# Patient Record
Sex: Female | Born: 1970 | Race: White | Hispanic: No | Marital: Single | Smoking: Never smoker
Health system: Southern US, Community
[De-identification: ages and names within clinical notes are randomized; demographics above are authoritative.]

## PROBLEM LIST (undated history)

## (undated) DIAGNOSIS — E282 Polycystic ovarian syndrome: Secondary | ICD-10-CM

## (undated) DIAGNOSIS — T7840XA Allergy, unspecified, initial encounter: Secondary | ICD-10-CM

## (undated) DIAGNOSIS — R7303 Prediabetes: Secondary | ICD-10-CM

## (undated) HISTORY — DX: Allergy, unspecified, initial encounter: T78.40XA

## (undated) HISTORY — DX: Polycystic ovarian syndrome: E28.2

## (undated) HISTORY — PX: TONSILLECTOMY: SUR1361

## (undated) HISTORY — DX: Prediabetes: R73.03

## (undated) HISTORY — PX: COLONOSCOPY: SHX174

## (undated) HISTORY — PX: POLYPECTOMY: SHX149

## (undated) HISTORY — PX: CHOLECYSTECTOMY: SHX55

---

## 2003-07-03 ENCOUNTER — Other Ambulatory Visit: Admission: RE | Admit: 2003-07-03 | Discharge: 2003-07-03 | Payer: Self-pay | Admitting: Obstetrics and Gynecology

## 2003-09-03 ENCOUNTER — Encounter: Admission: RE | Admit: 2003-09-03 | Discharge: 2003-09-03 | Payer: Self-pay | Admitting: Family Medicine

## 2003-09-30 ENCOUNTER — Encounter (INDEPENDENT_AMBULATORY_CARE_PROVIDER_SITE_OTHER): Payer: Self-pay | Admitting: Specialist

## 2003-10-01 ENCOUNTER — Inpatient Hospital Stay (HOSPITAL_COMMUNITY): Admission: RE | Admit: 2003-10-01 | Discharge: 2003-10-03 | Payer: Self-pay | Admitting: General Surgery

## 2004-03-26 ENCOUNTER — Other Ambulatory Visit: Admission: RE | Admit: 2004-03-26 | Discharge: 2004-03-26 | Payer: Self-pay | Admitting: Obstetrics and Gynecology

## 2004-09-29 ENCOUNTER — Inpatient Hospital Stay (HOSPITAL_COMMUNITY): Admission: AD | Admit: 2004-09-29 | Discharge: 2004-09-29 | Payer: Self-pay | Admitting: Obstetrics and Gynecology

## 2004-10-03 ENCOUNTER — Inpatient Hospital Stay (HOSPITAL_COMMUNITY): Admission: AD | Admit: 2004-10-03 | Discharge: 2004-10-03 | Payer: Self-pay | Admitting: Obstetrics and Gynecology

## 2004-10-08 ENCOUNTER — Inpatient Hospital Stay (HOSPITAL_COMMUNITY): Admission: AD | Admit: 2004-10-08 | Discharge: 2004-10-10 | Payer: Self-pay | Admitting: Obstetrics and Gynecology

## 2004-11-26 ENCOUNTER — Other Ambulatory Visit: Admission: RE | Admit: 2004-11-26 | Discharge: 2004-11-26 | Payer: Self-pay | Admitting: Obstetrics and Gynecology

## 2008-08-13 ENCOUNTER — Encounter: Admission: RE | Admit: 2008-08-13 | Discharge: 2008-08-13 | Payer: Self-pay | Admitting: Family Medicine

## 2009-03-03 ENCOUNTER — Emergency Department (HOSPITAL_COMMUNITY): Admission: EM | Admit: 2009-03-03 | Discharge: 2009-03-03 | Payer: Self-pay | Admitting: Emergency Medicine

## 2009-07-08 ENCOUNTER — Emergency Department (HOSPITAL_COMMUNITY): Admission: EM | Admit: 2009-07-08 | Discharge: 2009-07-08 | Payer: Self-pay | Admitting: Emergency Medicine

## 2010-06-23 ENCOUNTER — Encounter: Payer: Self-pay | Admitting: Gastroenterology

## 2010-06-25 ENCOUNTER — Encounter: Payer: Self-pay | Admitting: Gastroenterology

## 2010-06-25 ENCOUNTER — Encounter
Admission: RE | Admit: 2010-06-25 | Discharge: 2010-06-25 | Payer: Self-pay | Source: Home / Self Care | Attending: Endocrinology | Admitting: Endocrinology

## 2010-07-09 ENCOUNTER — Encounter: Payer: Self-pay | Admitting: Gastroenterology

## 2010-07-13 ENCOUNTER — Ambulatory Visit (INDEPENDENT_AMBULATORY_CARE_PROVIDER_SITE_OTHER): Payer: BC Managed Care – PPO | Admitting: Gastroenterology

## 2010-07-13 ENCOUNTER — Encounter: Payer: Self-pay | Admitting: Gastroenterology

## 2010-07-13 DIAGNOSIS — R1031 Right lower quadrant pain: Secondary | ICD-10-CM | POA: Insufficient documentation

## 2010-07-15 NOTE — Letter (Signed)
Summary: New Patient letter  [facility]  [address] [place], [place] [address]   Phone: [phone]  Fax: [phone]       07/09/2010 MRN: [id number]  [facility] [address] [place], [place]  [address]  Dear Ms. Umbarger,  Welcome to the Gastroenterology Division at [facility].    You are scheduled to see Dr.  Arlyce Dice on 07-13-10 at 3pm on the 3rd floor at [facility], [address]  We ask that you try to arrive at our office 15 minutes prior to your appointment time to allow for check-in.  We would like you to complete the enclosed self-administered evaluation form prior to your visit and bring it with you on the day of your appointment.  We will review it with you.  Also, please bring a complete list of all your medications or, if you prefer, bring the medication bottles and we will list them.  Please bring your insurance card so that we may make a copy of it.  If your insurance requires a referral to see a specialist, please bring your referral form from your primary care physician.  Co-payments are due at the time of your visit and may be paid by cash, check or credit card.     Your office visit will consist of a consult with your physician (includes a physical exam), any laboratory testing he/she may order, scheduling of any necessary diagnostic testing (e.g. x-ray, ultrasound, CT-scan), and scheduling of a procedure (e.g. Endoscopy, Colonoscopy) if required.  Please allow enough time on your schedule to allow for any/all of these possibilities.    If you cannot keep your appointment, please call [phone] to cancel or reschedule prior to your appointment date.  This allows [place] the opportunity to schedule an appointment for another patient in need of care.  If you do not cancel or reschedule by 5 p.m. the business day prior to your appointment date, you will be charged a $50.00 late cancellation/no-show fee.    Thank you for  choosing [facility] for your medical needs.  We appreciate the opportunity to care for you.  Please visit [place] at our website  to learn more about our practice.                     Sincerely,                                                             The Gastroenterology Division

## 2010-07-21 ENCOUNTER — Other Ambulatory Visit (AMBULATORY_SURGERY_CENTER): Payer: BC Managed Care – PPO | Admitting: Gastroenterology

## 2010-07-21 ENCOUNTER — Other Ambulatory Visit: Payer: Self-pay | Admitting: Gastroenterology

## 2010-07-21 DIAGNOSIS — D126 Benign neoplasm of colon, unspecified: Secondary | ICD-10-CM

## 2010-07-21 DIAGNOSIS — R109 Unspecified abdominal pain: Secondary | ICD-10-CM

## 2010-07-21 DIAGNOSIS — K648 Other hemorrhoids: Secondary | ICD-10-CM

## 2010-07-23 NOTE — Letter (Signed)
Summary: [facility] Instructions  [facility]  [address] [place], [place] [address]   Phone: [phone]  Fax: [phone]       [name]    [birth year]    MRN: [id number]        Procedure Day /Date:TUESDAY 07/21/2010     Arrival Time:1PM     Procedure Time:2PM     Location of Procedure:                    X   [facility] (4th Floor)                        PREPARATION FOR COLONOSCOPY WITH MOVIPREP   Starting 5 days prior to your procedure2/02/2011  do not eat nuts, seeds, popcorn, corn, beans, peas,  salads, or any raw vegetables.  Do not take any fiber supplements (e.g. Metamucil, Citrucel, and Benefiber).  THE DAY BEFORE YOUR PROCEDURE         DATE: 07/20/2010  ONG:EXBMWU  1.  Drink clear liquids the entire day-NO SOLID FOOD  2.  Do not drink anything colored red or purple.  Avoid juices with pulp.  No orange juice.  3.  Drink at least 64 oz. (8 glasses) of fluid/clear liquids during the day to prevent dehydration and help the prep work efficiently.  CLEAR LIQUIDS INCLUDE: Water Jello Ice Popsicles Tea (sugar ok, no milk/cream) Powdered fruit flavored drinks Coffee (sugar ok, no milk/cream) Gatorade Juice: apple, white grape, white cranberry  Lemonade Clear bullion, consomm, broth Carbonated beverages (any kind) Strained chicken noodle soup Hard Candy                             4.  In the morning, mix first dose of MoviPrep solution:    Empty 1 Pouch A and 1 Pouch B into the disposable container    Add lukewarm drinking water to the top line of the container. Mix to dissolve    Refrigerate (mixed solution should be used within 24 hrs)  5.  Begin drinking the prep at 5:00 p.m. The MoviPrep container is divided by 4 marks.   Every 15 minutes drink the solution down to the next mark (approximately 8 oz) until the full liter is complete.   6.  Follow completed prep with 16 oz of clear liquid of your choice (Nothing red or  purple).  Continue to drink clear liquids until bedtime.  7.  Before going to bed, mix second dose of MoviPrep solution:    Empty 1 Pouch A and 1 Pouch B into the disposable container    Add lukewarm drinking water to the top line of the container. Mix to dissolve    Refrigerate  THE DAY OF YOUR PROCEDURE      DATE: 07/21/2010  DAY: TUESDAY  Beginning at 9a.m. (5 hours before procedure):         1. Every 15 minutes, drink the solution down to the next mark (approx 8 oz) until the full liter is complete.  2. Follow completed prep with 16 oz. of clear liquid of your choice.    3. You may drink clear liquids until 12PM (2 HOURS BEFORE PROCEDURE).   MEDICATION INSTRUCTIONS  Unless otherwise instructed, you should take regular prescription medications with a small sip of water   as early as possible the morning of your procedure.  OTHER INSTRUCTIONS  You will need a responsible adult at least 42 years of age to accompany you and drive you home.   This person must remain in the waiting room during your procedure.  Wear loose fitting clothing that is easily removed.  Leave jewelry and other valuables at home.  However, you may wish to bring a book to read or  an iPod/MP3 player to listen to music as you wait for your procedure to start.  Remove all body piercing jewelry and leave at home.  Total time from sign-in until discharge is approximately 2-3 hours.  You should go home directly after your procedure and rest.  You can resume normal activities the  day after your procedure.  The day of your procedure you should not:   Drive   Make legal decisions   Operate machinery   Drink alcohol   Return to work  You will receive specific instructions about eating, activities and medications before you leave.    The above instructions have been reviewed and explained to me by   _______________________    I fully understand and can verbalize these instructions  _____________________________ Date _________

## 2010-07-23 NOTE — Letter (Signed)
Summary: Results Letter  [facility]  [address] [place], [place] [address]   Phone: [phone]  Fax: [phone]        July 13, 2010 MRN: [id number]    [facility] [address] [place], [place]  [address]    Dear Ms. [facility],  It is my pleasure to have treated you recently as a new patient in my office. I appreciate your confidence and the opportunity to participate in your care.  Since I do have a busy inpatient endoscopy schedule and office schedule, my office hours vary weekly. I am, however, available for emergency calls everyday through my office. If I am not available for an urgent office appointment, another one of our gastroenterologist will be able to assist you.  My well-trained staff are prepared to help you at all times. For emergencies after office hours, a physician from our Gastroenterology section is always available through my 24 hour answering service  Once again I welcome you as a new patient and I look forward to a happy and healthy relationship             Sincerely,  Louis Meckel MD  This letter has been electronically signed by your physician.  Appended Document: Results Letter letter mailed

## 2010-07-23 NOTE — Assessment & Plan Note (Signed)
Summary: LLQABD PAIN/SCHED W-KAREN [id number]/INS BLU CROSS/MAILER SENT/C...   History of Present Illness Visit Type: Initial Consult Primary GI MD: Melvia Heaps MD [facility] Primary Provider: Shaune Pollack, MD Requesting Provider: Marcelle Overlie, MD Chief Complaint: Since the beginning of January pt has intermittant sharp RLQ abd pains wth some dull constant dicsomfort with urgent stools after the pain. Pt states she has soft stools afterwards but not loose.  History of Present Illness:   [name] is a pleasant 40 year old white female referred at the request of Dr. Thana Ates for evaluation of abdominal pain.  For the past month almost daily she has complained of moderate right lower quadrant pain.  This is often followed by a bowel movement.  Bowels are soft.  She has had minimal amounts of blood on the toilet tissue but none in the water.  Recent CT scan did not show any abnormalities in the abdomen or pelvis.  She has a history of polycystic ovarian disease.  There is no history of melena.  Symptoms occur almost daily.   GI Review of Systems    Reports abdominal pain and  loss of appetite.     Location of  Abdominal pain: RLQ.    Denies acid reflux, belching, bloating, chest pain, dysphagia with liquids, dysphagia with solids, heartburn, nausea, vomiting, vomiting blood, weight loss, and  weight gain.      Reports change in bowel habits and  hemorrhoids.     Denies anal fissure, black tarry stools, constipation, diarrhea, diverticulosis, fecal incontinence, heme positive stool, irritable bowel syndrome, jaundice, light color stool, liver problems, rectal bleeding, and  rectal pain. Preventive Screening-Counseling & Management  Alcohol-Tobacco     Smoking Status: never      Drug Use:  no.      Current Medications (verified): 1)  None  Allergies (verified): 1)  ! Penicillin 2)  ! * Mycin  Past History:  Past Medical History: Gallstones Polycystic ovary disorder  Past Surgical  History: Cholecystectomy Tonsillectomy  Family History: Family History of Colon Cancer:Maternal Grandmother Family History of Pancreatic Cancer:Paternal Grandmother Family History of Diabetes: Maternal Grandfather Family History of Heart Disease: Grandmparents  Social History: Married Patient has never smoked.  Alcohol Use - no Daily Caffeine Use Illicit Drug Use - no Smoking Status:  never Drug Use:  no  Review of Systems       The patient complains of allergy/sinus, back pain, cough, fatigue, and headaches-new.  The patient denies anemia, anxiety-new, arthritis/joint pain, blood in urine, breast changes/lumps, change in vision, confusion, coughing up blood, depression-new, fainting, fever, hearing problems, heart murmur, heart rhythm changes, itching, menstrual pain, muscle pains/cramps, night sweats, nosebleeds, pregnancy symptoms, shortness of breath, skin rash, sleeping problems, sore throat, swelling of feet/legs, swollen lymph glands, thirst - excessive , urination - excessive , urination changes/pain, urine leakage, vision changes, and voice change.         All other systems were reviewed and were negative   Vital Signs:  Patient profile:   40 year old female Height:      66 inches Weight:      206 pounds BMI:     33.37 Pulse rate:   78 / minute Pulse rhythm:   regular BP sitting:   134 / 72  (right arm) Cuff size:   regular  Vitals Entered By: Christie Nottingham CMA Duncan Dull) (July 13, 2010 3:18 PM)  Physical Exam  Additional Exam:  On physical exam she is a well-developed well-nourished female  skin:  anicteric HEENT: normocephalic; PEERLA; no nasal or pharyngeal abnormalities neck: supple nodes: no cervical lymphadenopathy chest: clear to ausculatation and percussion heart: no murmurs, gallops, or rubs abd: soft, nontender; BS normoactive; no abdominal masses,  organomegaly;  there is minimal tenderness to palpation in the right lower quadrant.  Tenderness is  slightly increased with abdominal muscle wall flexion rectal: deferred ext: no cynanosis, clubbing, edema skeletal: no deformities neuro: oriented x 3; no focal abnormalities    Impression & Recommendations:  Problem # 1:  ABDOMINAL PAIN RIGHT LOWER QUADRANT (ICD-789.03) Assessment New  Pain seems to precede her bowel movements.  Physical exam suggests abdominal wall pain, however, with the association of bowel movements I think a colonic abnormality should be ruled out.  Risks, alternatives, and complications of the procedure, including bleeding, perforation, and possible need for surgery, were explained to the patient.  Patient's questions were answered.   Recommendations #1 colonoscopy #2 trial of anti-inflammatory medications  Orders: Colonoscopy (Colon)  Patient Instructions: 1)  Copy sent to : Shaune Pollack, MD;Michelle Vincente Poli, MD 2)  Your Colonoscopy is scheduled for 07/21/2010 at 2pm 3)  You can pick up your MoviPrep today from your pharmacy 4)  Colonoscopy and Flexible Sigmoidoscopy brochure given.  5)  Conscious Sedation brochure given.  6)  The medication list was reviewed and reconciled.  All changed / newly prescribed medications were explained.  A complete medication list was provided to the patient / caregiver. Prescriptions: MOVIPREP 100 GM  SOLR (PEG-KCL-NACL-NASULF-NA ASC-C) As per prep instructions.  #1 x 0   Entered by:   Merri Ray CMA (AAMA)   Authorized by:   Louis Meckel MD   Signed by:   Merri Ray CMA (AAMA) on 07/13/2010   Method used:   Electronically to        [facility] (retail)       [address]       [place], [place]  [address]       Ph: [phone]       Fax: [phone]   RxID:   [id number]

## 2010-07-28 ENCOUNTER — Encounter: Payer: Self-pay | Admitting: Gastroenterology

## 2010-07-29 NOTE — Procedures (Addendum)
Summary: Colonoscopy  Patient: [name] Note: All result statuses are Final unless otherwise noted.  Tests: (1) Colonoscopy (COL)   COL Colonoscopy           DONE     [facility]     [address]     [place], [place]  [address]           COLONOSCOPY PROCEDURE REPORT           PATIENT:  [name], Erika  MR#:  [id number]     BIRTHDATE:  04-Jun-1971, 39 yrs. old  GENDER:  female           ENDOSCOPIST:  Barbette Hair. Arlyce Dice, MD     Referred by:  Shaune Pollack, M.D.     Marcelle Overlie, M.D.           PROCEDURE DATE:  07/21/2010     PROCEDURE:  Colonoscopy with polypectomy and submucosal injection     ASA CLASS:  Class I     INDICATIONS:  1) Abdominal pain           MEDICATIONS:   Fentanyl 100 mcg IV, Versed 10 mg IV           DESCRIPTION OF PROCEDURE:   After the risks benefits and     alternatives of the procedure were thoroughly explained, informed     consent was obtained.  Digital rectal exam was performed and     revealed no abnormalities.   The LB CF-[id number] endoscope     was introduced through the anus and advanced to the cecum, which     was identified by both the appendix and ileocecal valve, without     limitations.  The quality of the prep was good, using MoviPrep.     The instrument was then slowly withdrawn as the colon was fully     examined.     <<PROCEDUREIMAGES>>           FINDINGS:  A sessile polyp was found in the ascending colon. It     was 15 mm in size. submucosal injection Polyps were snared, then     cauterized with monopolar cautery. Retrieval was successful (see     image4, image6, and image8). snare polyp 3cc NS  Internal     Hemorrhoids were found (see image17).  This was otherwise a normal     examination of the colon (see image7, image10, image11, image13,     image14, and image16).   Retroflexed views in the rectum revealed     no abnormalities.    The time to cecum =  5.50  minutes. The scope     was then withdrawn (time  =  8.0  min) from the patient and the     procedure completed.           COMPLICATIONS:  None           ENDOSCOPIC IMPRESSION:     1) 15 mm sessile polyp in the ascending colon     2) Internal hemorrhoids     3) Otherwise normal examination     RECOMMENDATIONS:     1) If the polyp(s) removed today are proven to be adenomatous     (pre-cancerous) polyps, you will need a colonoscopy in 3 years.     Otherwise you should continue to follow colorectal cancer     screening guidelines for "routine risk" patients with a     colonoscopy in 10 years.  2) local heat, antiinflammatory meds for lower abdominal pain (no     NSAIDS for 5 days)           REPEAT EXAM:   You will receive a letter from Dr. Arlyce Dice in 1-2     weeks, after reviewing the final pathology, with followup     recommendations.           ______________________________     Barbette Hair Arlyce Dice, MD           CC:           n.     eSIGNED:   Barbette Hair. Brittni Hult at 07/21/2010 03:01 PM           Page 2 of 3   [name], [name], [id number]  Note: An exclamation mark (!) indicates a result that was not dispersed into the flowsheet. Document Creation Date: 07/21/2010 3:01 PM _______________________________________________________________________  (1) Order result status: Final Collection or observation date-time: 07/21/2010 14:53 Requested date-time:  Receipt date-time:  Reported date-time:  Referring Physician:   Ordering Physician: Melvia Heaps [phone]) Specimen Source:  Source: Launa Grill Order Number: [id number] Lab site:   Appended Document: Colonoscopy     Procedures Next Due Date:    Colonoscopy: 07/2013

## 2010-08-04 NOTE — Letter (Signed)
Summary: Patient Notice- Polyp Results  [facility]  [address] [place], [place] [address]   Phone: [phone]  Fax: [phone]        July 28, 2010 MRN: [id number]    [facility] [address] [place], [place]  [address]    Dear [name],  I am pleased to inform you that the colon polyp(s) removed during your recent colonoscopy was (were) found to be benign (no cancer detected) upon pathologic examination.  I recommend you have a repeat colonoscopy examination in 3_ years to look for recurrent polyps, as having colon polyps increases your risk for having recurrent polyps or even colon cancer in the future.  Should you develop new or worsening symptoms of abdominal pain, bowel habit changes or bleeding from the rectum or bowels, please schedule an evaluation with either your primary care physician or with me.  Additional information/recommendations:  __ No further action with gastroenterology is needed at this time. Please      follow-up with your primary care physician for your other healthcare      needs.  __ Please call [phone] to schedule a return visit to review your      situation.  __ Please keep your follow-up visit as already scheduled.  _x_ Continue treatment plan as outlined the day of your exam.  Please call us if you are having persistent problems or have questions about your condition that have not been fully answered at this time.  Sincerely,  Louis Meckel MD  This letter has been electronically signed by your physician.  Appended Document: Patient Notice- Polyp Results letter mailed

## 2010-10-23 NOTE — Op Note (Signed)
[name], [name]                     ACCOUNT NO.:  [id number]   MEDICAL RECORD NO.:  [id number]                   PATIENT TYPE:  OBV   LOCATION:  0098                                 FACILITY:  [facility]   PHYSICIAN:  Ollen Gross. Vernell Morgans, M.D.              DATE OF BIRTH:  19-Oct-1970   DATE OF PROCEDURE:  09/30/2003  DATE OF DISCHARGE:                                 OPERATIVE REPORT   PREOPERATIVE DIAGNOSIS:  Gallstones.   POSTOPERATIVE DIAGNOSIS:  Gallstones.   OPERATION/PROCEDURE:  Laparoscopic cholecystectomy with intraoperative  cholangiogram.   SURGEON:  Ollen Gross. Carolynne Edouard, M.D.   ASSISTANT:  Angelia Mould. Derrell Lolling, M.D.   ANESTHESIA:  General endotracheal anesthesia.   DESCRIPTION OF PROCEDURE:  After informed consent was obtained, the patient  was brought to the operating room and placed in the supine position on the  operating room table.  After adequate induction of general anesthesia, the  patient's abdomen was prepped with Betadine and draped in the usual sterile  manner.  The area below the umbilicus was infiltrated with 0.25% Marcaine.  A small incision was made with the 15-blade knife.  This incision was  carried down through the subcutaneous tissue sharply with the Kelly clamp  and [employer] retractors until the linea alba was identified.  The linea  alba was incised with the 15-blade knife and each side was grasped with  Kocher clamps and elevated anteriorly.  The preperitoneal space was then  probed bluntly with a hemostat until the peritoneum was opened and access  was gained to the abdominal cavity.  A 0 Vicryl pursestring stitch was  placed in the fascia surrounding the opening. The Hasson cannula was placed  through the opening and anchored in place with the previously placed Vicryl  pursestring stitch.  The abdomen was then insufflated with carbon dioxide  without difficulty.  The patient was placed in the head-up position and  rotated slightly with the right  side up.  The laparoscope was placed through  Hasson cannula and the right upper quadrant was inspected.  The dome of the  gallbladder and liver were readily identified.  The epigastric region was  then infiltrated with 0.25% Marcaine.  A small incision was made with the 15-  blade knife and a 10 mm port was placed bluntly through this incision into  the abdominal cavity under direct vision.  Sites were then chosen laterally  on the right side of the abdomen with placement of a 5 mm ports.  Each of  these areas was infiltrated with 0.25% Marcaine.  Small stab incisions were  made with 15-blade knife and 5 mm ports were placed bluntly through these  incisions into the abdominal cavity under direct vision.  A blunt grasper  was placed through the lateral-most 5 mm port and used to grasp the dome of  the gallbladder and elevate it anteriorly and superiorly.  Another blunt  grasper was placed in  the other 5 mm port and used to retract on the body  and neck of the gallbladder.  The dissector was placed through the  epigastric port.  There were some adhesions of omentum to the gallbladder  body.  These were taken down sharply with the laparoscopic scissors.  The  dissector was placed through the epigastric port  and using the  electrocautery, the peritoneal reflection at the gallbladder neck area was  opened.  Blunt dissection was then carried out in this area until the  gallbladder neck-cystic duct junction was readily identified and a good  window was created.  A single clip was placed on the gallbladder neck.  A  small ductotomy was made just beneath the clip.  A 14-gauge angiocath was  then placed percutaneously through the anterior abdominal wall under direct  vision.  A Reddick cholangiogram catheter was placed through the angiocath  and flushed.  The Reddick catheter was then placed within the cystic duct  and anchored in place with a clip.  A cholangiogram was obtained that showed  no  obvious filling defects.  It was difficult to feed the catheter in the  cystic duct and would not go very far.  There was adequate length on the  cystic duct that had a corkscrew appearance.  There was no good emptying  into the duodenum although some contrast did seem to get by.  At this point  the anchoring clip and catheters were removed from the patient.  Three clips  were placed proximally on the cystic duct and the duct was divided between  the two sets of clips.  Posterior to this the cystic artery are identified  and again dissected partly in a circumferential manner until a good window  was created.  Two clips were placed proximally and one distally on the  artery and the artery was divided between the two.  OT was decided not to  try to send the Reddick catheter down the cystic duct and the common duct  because it would not feed because of the corkscrew appearance of the cystic.  Next, the laparoscopic hook cautery device was used to separate the  gallbladder from the liver bed.  Prior to completely detaching the  gallbladder from the liver bed, the liver bed was inspected and several  small bleeding points were coagulated with the electrocautery until the area  was completely hemostatic.  The gallbladder was then detached the rest of  the way from the liver bed without difficulty.  The laparoscopic was then  moved to the epigastric port.  A gallbladder grasper was placed through the  Hasson cannula and used to grasp the neck of the gallbladder.  The  gallbladder with the Hasson cannula was then removed through the  infraumbilical port without difficulty.  The fascial defect was closed with  the preplaced 0 Vicryl pursestring stitch as well as with another  interrupted 0 Vicryl stitch.  The liver bed was inspected again and found to  be completely hemostatic.  The abdomen was irrigated with copious amounts saline until the affluent was clear.  The ports were then all removed  under  direct vision and were found to be hemostatic.  The gas was allowed to  escape.  The skin incisions were all closed with interrupted 4-0 Monocryl  subcuticular stitches.  Benzoin and Steri-Strips were applied.  The patient  tolerated the procedure well.  At the end of the case all needle, sponge and  instrument counts were  correct.  The patient was awakened and taken to the  recovery room in stable condition.                                               Ollen Gross. Vernell Morgans, M.D.    PST/MEDQ  D:  09/30/2003  T:  09/30/2003  Job:  [id number]

## 2010-10-23 NOTE — Consult Note (Signed)
NAMEKANON, COLUNGA                     ACCOUNT NO.:  [id number]   MEDICAL RECORD NO.:  [id number]                   PATIENT TYPE:  INP   LOCATION:  0479                                 FACILITY:  [facility]   PHYSICIAN:  James L. Malon Kindle., M.D.          DATE OF BIRTH:  1970/11/23   DATE OF CONSULTATION:  10/01/2003  DATE OF DISCHARGE:                                   CONSULTATION   REASON FOR CONSULTATION:  Probable common bile duct stone.   HISTORY:  A 40 year old white female who has had intermittent abdominal pain  and nausea going back to December.  Work-up by Dr. Shaune Pollack, her family  doctor, revealed gallstones.  She underwent a laparoscopic cholecystectomy  yesterday by Dr. Carolynne Edouard with intraoperative cholangiogram suggesting a defect  in the distal duct with slight meniscus tear.  The patient's laboratories  revealed an increase in her labs with a total bilirubin up to 2.4, and a GPT  of 212, and a GOT of 201 with a normal alkaline phosphatase.  All this has  increased since her admission.  She is still a bit nauseated, but overall  feels okay.  She has been afebrile.   CURRENT MEDICATIONS:  1. Birth control pills.  2. Prenatal vitamins.  The patient is attempting to become pregnant.  3. Nasonex.  4. Zyrtec.   ALLERGIES:  PENICILLIN causes purpura.   MEDICAL HISTORY:  1. She has a history of allergies.  2. Has had bigeminy in the past.  3. No other chronic medical problems.   PAST SURGICAL HISTORY:  Tonsillectomy.   SOCIAL HISTORY:  She is married.  Drinks 1-2 glasses of alcohol a week.  Has  a 76-month-old child.   FAMILY HISTORY:  Pancreatic cancer in her grandmother.  There are no  gallstones in close family members, although she thinks her cousin had  gallstones.   PERTINENT LABORATORIES:  White count normal.  Total bilirubin 1.0 to 2.4.  GOT and GPT normal at over 200.   ASSESSMENT:  Probable common bile duct stone with continued postprandial  nausea and increased LFT's.  The intraoperative cholangiogram is a single  film, and there is some drainage into the duodenum, but there does appear to  be a filling defect in the distal common bile duct.  I agree ERCP would be  appropriate.   PLAN:  ERCP is planned for tomorrow by Dr. Ewing Schlein.  I have discussed the  potential risks and benefits, specifically bleeding, pancreatitis, failure  to cannulate, etc, with the patient and her husband.  Questions were asked  and answered.                                               James L. Malon Kindle., M.D.    Waldron Session  D:  10/01/2003  T:  10/01/2003  Job:  [id number]   cc:   Ollen Gross. Vernell Morgans, M.D.  [address]  [place]  [place] [address]  Fax: [phone]   Duncan Dull, M.D.  [address]  [place]  [place] [address]  Fax: [phone]

## 2010-10-23 NOTE — Op Note (Signed)
NAMEASTIN, RAPE                     ACCOUNT NO.:  [id number]   MEDICAL RECORD NO.:  [id number]                   PATIENT TYPE:  INP   LOCATION:  0479                                 FACILITY:  [facility]   PHYSICIAN:  Petra Kuba, M.D.                 DATE OF BIRTH:  12-Jan-1971   DATE OF PROCEDURE:  10/02/2003  DATE OF DISCHARGE:                                 OPERATIVE REPORT   PROCEDURE:  ERCP with sphincterotomy, stone extraction.   INDICATION:  Probable common bile duct stone on intraop cholangiogram and  elevated liver tests, persistent pain.  Consent was signed after risks,  benefits, methods, options thoroughly discussed prior to any premedications  given by me and also yesterday by Dr. Randa Evens.   MEDICINES USED:  1. Demerol 100.  2. Versed 11.   DESCRIPTION OF PROCEDURE:  The side-viewing video therapeutic duodenoscope  was inserted by indirect vision into the stomach, advanced through a normal  antrum and pylorus and into the duodenum where a slightly bulbous ampulla  was brought into view.  Using the triple-lumen sphincterotome, an initial PD  was injected.  It was not overfilled, did appear normal.  The sphincterotome  was repositioned and using the Jagwire, we were able to get deep selective  cannulation.  On initial CBD injection, a small stone was seen.  The  intrahepatics were normal.  We went ahead and proceeded with a moderate-  sized sphincterotomy until we got adequate biliary drainage and able to get  the fully bowed sphincterotome easily in and out of the duct.  We went ahead  and exchanged the sphincterotome for an 8.5 mm balloon and on initial  balloon pull-through, a small stone was delivered.  Two subsequent balloon  pull-throughs did not reveal any additional stones.  An occlusion  cholangiogram was done which was normal.  The balloon passed with only  minimal resistance through the sphincterotomy site.  There was slightly  sluggish drainage  after the procedure but no obvious residual stone or  problem.  We elected to stop the procedure at this junction.  Scope was  removed.  The patient tolerated the procedure well.  There was no obvious  immediate complication.   ENDOSCOPIC DIAGNOSES:  1. Slightly bulbous ampulla.  2. Normal pancreatic duct on one injection, tried not to overfill.  3. Positive small common bile duct stone on initial injection.  4. Normal intrahepatics.  5. Status post moderate-sized sphincterotomy.  6. An 8.5 balloon pull-through with a small stone delivered and two negative     pull-throughs and negative occlusion cholangiogram after that.   PLAN:  1. Observe for delayed complications.  If none, follow liver tests back to     normal and hopefully be able to advance diet tomorrow and home soon.  2. No aspirin or nonsteroidals for 2 weeks secondary to sphincterotomy.  Petra Kuba, M.D.    MEM/MEDQ  D:  10/02/2003  T:  10/02/2003  Job:  [id number]   cc:   Duncan Dull, M.D.  [address]  [place]  [place] [address]  Fax: [phone]   Llana Aliment. Malon Kindle., M.D.  [address]  [place]  [place] [address]  Fax: [phone]   Ollen Gross. Vernell Morgans, M.D.  [address]  [place]  [place] [address]  Fax: [phone]

## 2011-03-03 IMAGING — CT CT ABD-PELV W/ CM
1 of 5 series · 14 of 36 positions shown, 19 images · IV contrast (30CC OMNI 300 & [ID] OMNI 300)
Comparison: None

CLINICAL DATA: Right lower quadrant abdominal pain

CT ABDOMEN AND PELVIS WITH CONTRAST
TECHNIQUE: Multidetector CT imaging of the abdomen and pelvis was
performed following the standard protocol during bolus
administration of intravenous contrast.
Contrast: 100 ml of omni 300

[Series 2: abdomen w/ · axial · 0.70mm/px · z∈[-368,+2]mm · 14 of 81 slices shown, 19 images]
[im 5/81  soft-tissue]
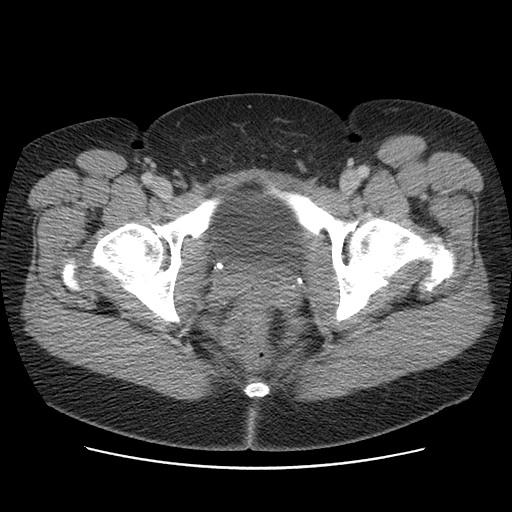
[im 5/81  bone]
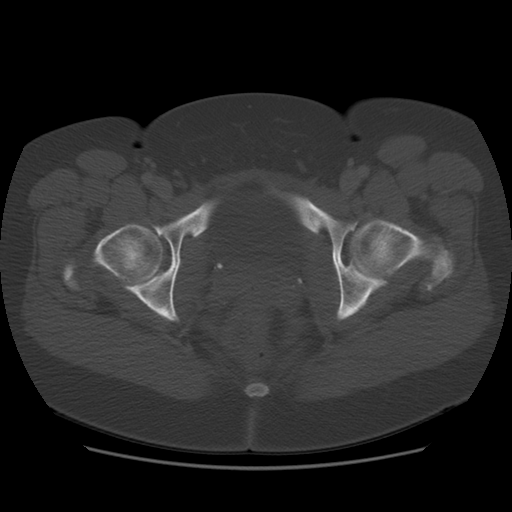
[im 10/81  soft-tissue]
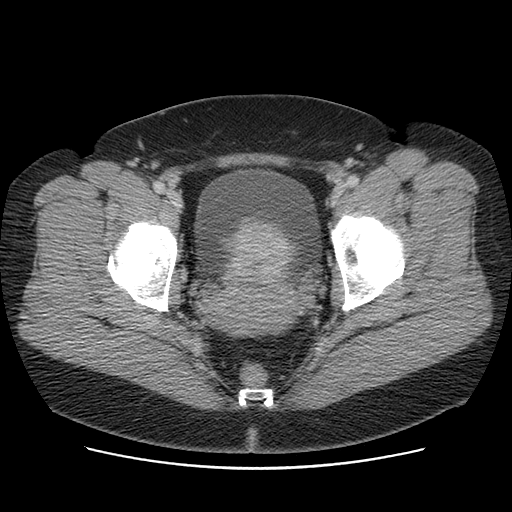
[im 19/81  soft-tissue]
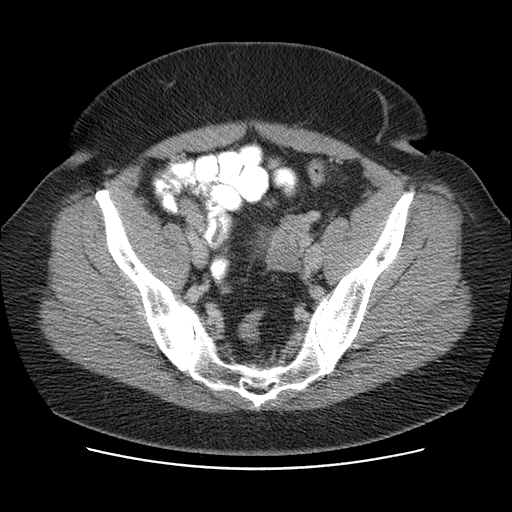
[im 24/81  soft-tissue]
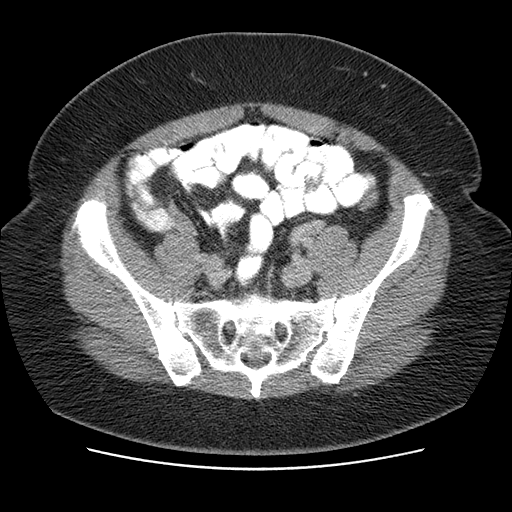
[im 29/81  soft-tissue]
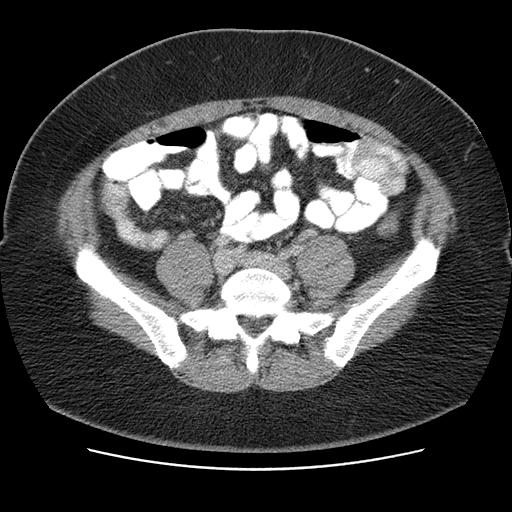
[im 33/81  soft-tissue]
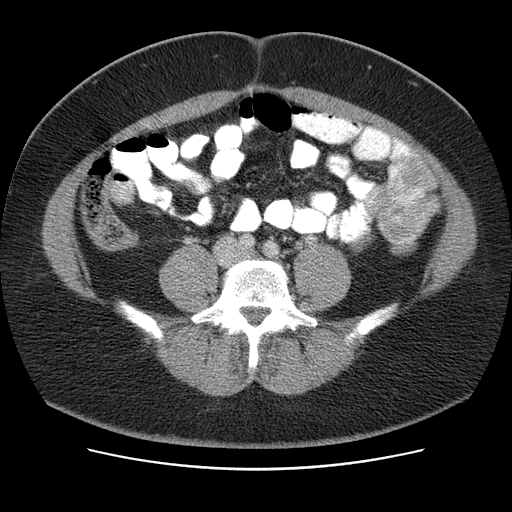
[im 43/81  soft-tissue]
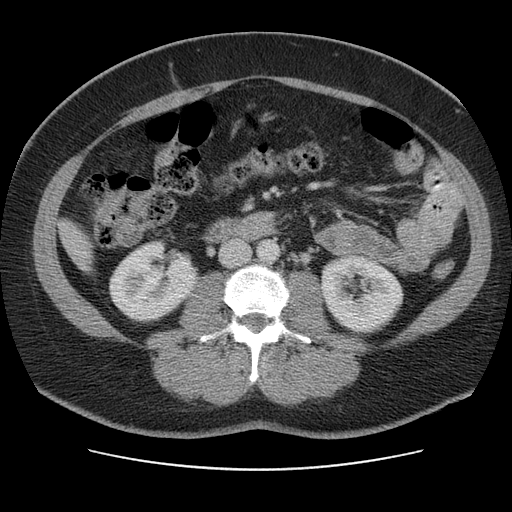
[im 48/81  soft-tissue]
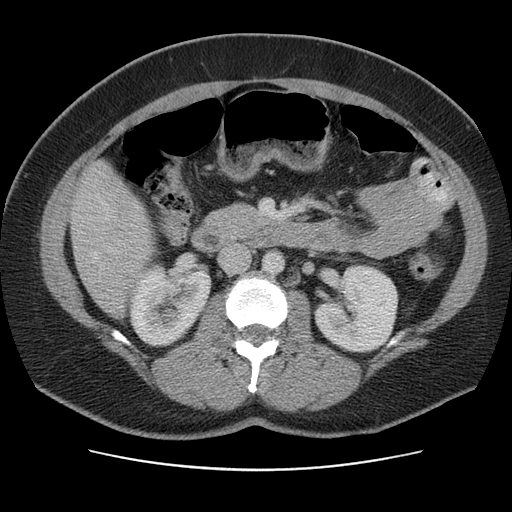
[im 52/81  soft-tissue]
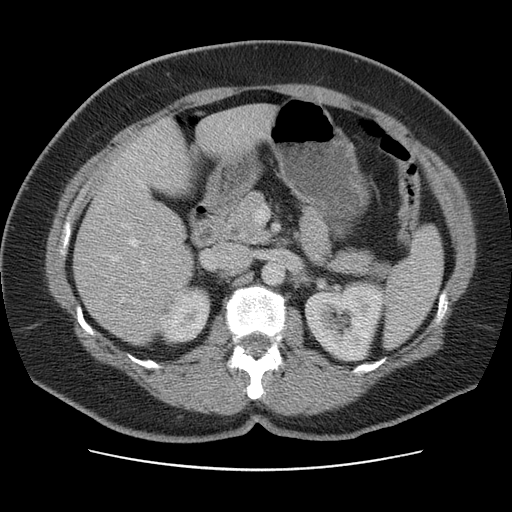
[im 52/81  bone]
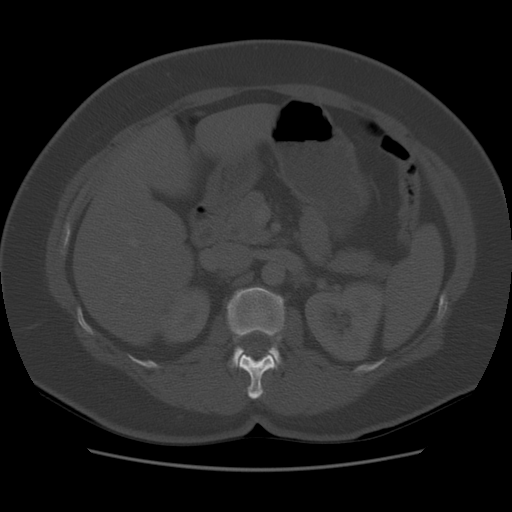
[im 57/81  soft-tissue]
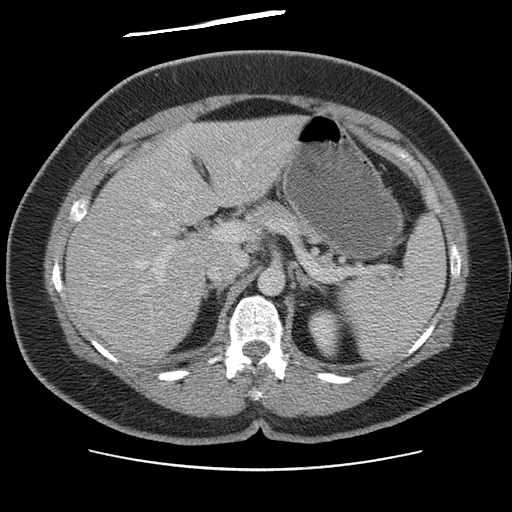
[im 62/81  soft-tissue]
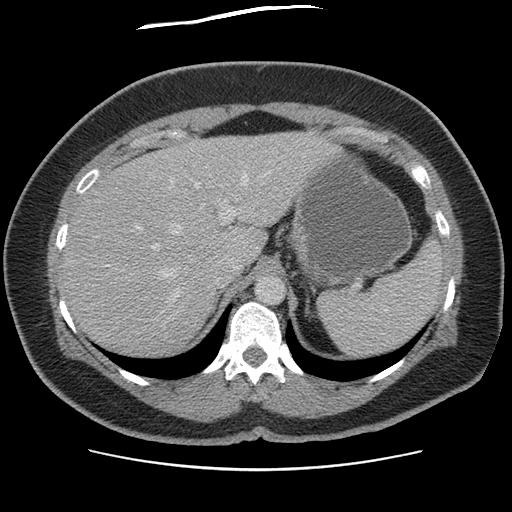
[im 62/81  lung]
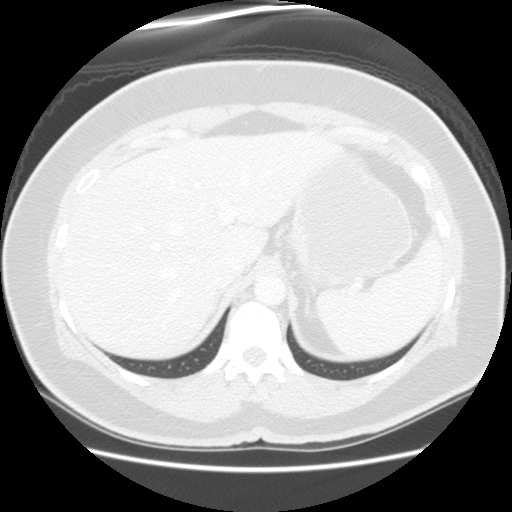
[im 66/81  lung]
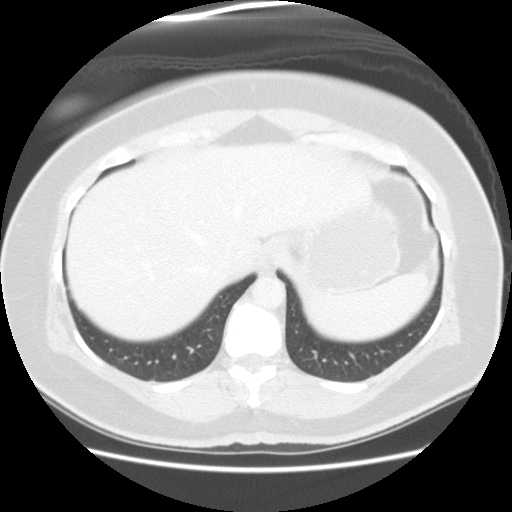
[im 71/81  soft-tissue]
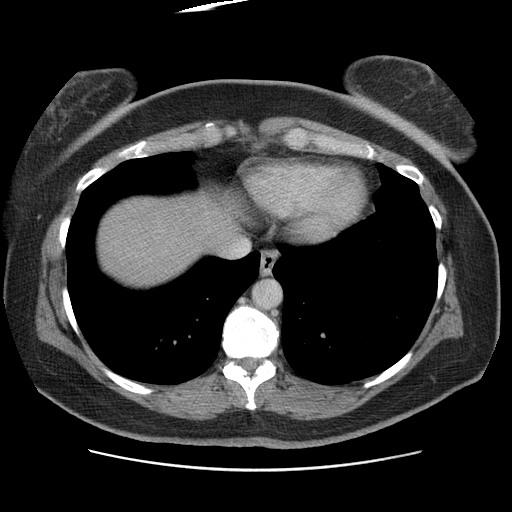
[im 71/81  lung]
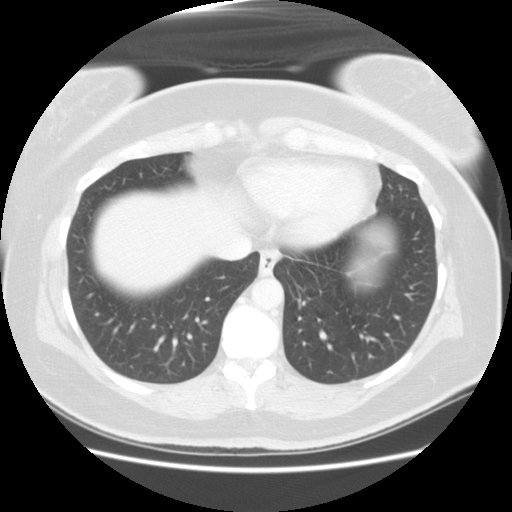
[im 76/81  soft-tissue]
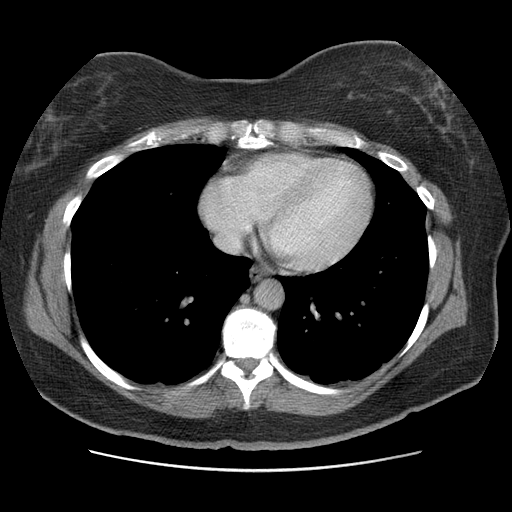
[im 76/81  lung]
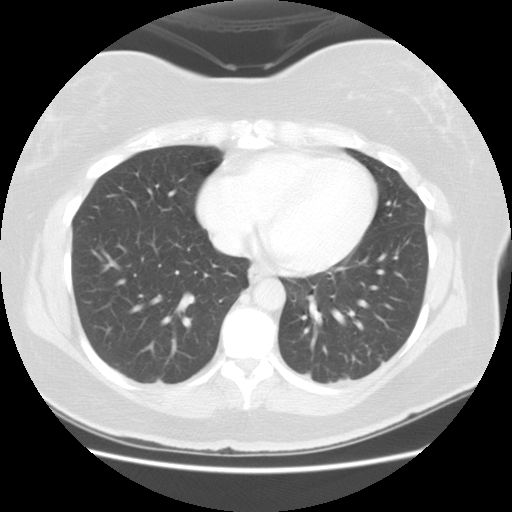

[14 of 36 positions shown; findings below may reference images not displayed]

FINDINGS: The lung bases appear clear.

No pericardial or pleural effusion identified.

Mild fatty infiltration of the liver.  Within the lateral segment
left hepatic lobe there is an 8 mm subcapsular the hypodensity.  No
additional focal liver abnormalities identified.  Status post
cholecystectomy.  No biliary dilatation identified.

Pancreas appears normal.

The spleen appears normal.

Both kidneys are unremarkable.

There is no upper abdominal adenopathy.

There is no pelvic or inguinal adenopathy.

The uterus and the adnexal structures appear normal for the
patient's age.

The urinary bladder appears normal.  The stomach and the small
bowel loops appear normal.

The colon is negative.

Review of the visualized osseous structures is unremarkable.
IMPRESSION: 1.  There is no mass or adenopathy within the upper abdomen or
pelvis.
2.  Mild fatty infiltration of the liver.

## 2011-10-20 ENCOUNTER — Other Ambulatory Visit: Payer: Self-pay | Admitting: Obstetrics and Gynecology

## 2012-09-04 ENCOUNTER — Ambulatory Visit (INDEPENDENT_AMBULATORY_CARE_PROVIDER_SITE_OTHER): Payer: BC Managed Care – PPO | Admitting: Gastroenterology

## 2012-09-04 ENCOUNTER — Encounter: Payer: Self-pay | Admitting: Gastroenterology

## 2012-09-04 VITALS — BP 132/84 | HR 72 | Ht 66.0 in | Wt 224.6 lb

## 2012-09-04 DIAGNOSIS — R1031 Right lower quadrant pain: Secondary | ICD-10-CM

## 2012-09-04 DIAGNOSIS — Z860100 Personal history of colon polyps, unspecified: Secondary | ICD-10-CM | POA: Insufficient documentation

## 2012-09-04 MED ORDER — SULINDAC 200 MG PO TABS: ORAL_TABLET | ORAL | Status: DC

## 2012-09-04 NOTE — Patient Instructions (Addendum)
Your medication is being sent to your pharmacy Call back in 2 weeks if no better

## 2012-09-04 NOTE — Progress Notes (Signed)
History of Present Illness: Pleasant 42 year old white female referred at the request of Dr. Kevan Ny for evaluation of abdominal pain. She was evaluated in the past for similar pain. She has pain in the area of the right groin that may radiate around to the back and perhaps down the leg. It is unaffected by eating or bowel movements. There's been no change in her bowel habits. She denies rectal bleeding. CT scan in January, 2012 for lower quadrant pain was unrevealing except for mild hepatic steatosis.  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    History reviewed. No pertinent past medical history. Past Surgical History  Procedure Laterality Date  . Cholecystectomy     family history includes Colon cancer in her maternal grandmother; Diabetes in her maternal grandfather and mother; Heart disease in her paternal grandfather and paternal grandmother; and Pancreatic cancer in her paternal grandmother. Current Outpatient Prescriptions  Medication Sig Dispense Refill  . loratadine (CLARITIN) 10 MG tablet Take 10 mg by mouth daily.       No current facility-administered medications for this visit.   Allergies as of 09/04/2012 - Review Complete 09/04/2012  Allergen Reaction Noted  . Latex  09/04/2012  . Penicillins      reports that she has never smoked. She has never used smokeless tobacco. She reports that she does not drink alcohol or use illicit drugs.     Review of Systems: She complains of bilateral knee pain Pertinent positive and negative review of systems were noted in the above HPI section. All other review of systems were otherwise negative.  Vital signs were reviewed in today's medical record Physical Exam: General: Well developed , well nourished, no acute distress Skin: anicteric Head: Normocephalic and atraumatic Eyes:  sclerae anicteric, EOMI Ears: Normal auditory acuity Mouth: No deformity or lesions Neck: Supple, no masses or thyromegaly Lungs: Clear  throughout to auscultation Heart: Regular rate and rhythm; no murmurs, rubs or bruits Abdomen: Soft, non tender and non distended. No masses, hepatosplenomegaly or hernias noted. Normal Bowel sounds Rectal:deferred Musculoskeletal: Symmetrical with no gross deformities; there is tenderness in the right groin without obvious hernia. Tenderness worsens with abdominal muscle wall flexion  Skin: No lesions on visible extremities Pulses:  Normal pulses noted Extremities: No clubbing, cyanosis, edema or deformities noted Neurological: Alert oriented x 4, grossly nonfocal Cervical Nodes:  No significant cervical adenopathy Inguinal Nodes: No significant inguinal adenopathy Psychological:  Alert and cooperative. Normal mood and affect

## 2012-09-04 NOTE — Assessment & Plan Note (Signed)
I strongly suspect that pain is related to musculoskeletal pain, perhaps nerve root pain. There is no evidence for hernia. Prior CT and colonoscopy for similar complaints were unrevealing for source of pain.  Recommendations #1 trial of Clinoril 200 mg twice a day for one week and then as needed.

## 2012-09-04 NOTE — Assessment & Plan Note (Signed)
Follow-up colonoscopy 2017 

## 2012-11-01 ENCOUNTER — Other Ambulatory Visit: Payer: Self-pay | Admitting: Obstetrics and Gynecology

## 2013-05-16 ENCOUNTER — Encounter: Payer: Self-pay | Admitting: Gastroenterology

## 2013-06-29 ENCOUNTER — Telehealth: Payer: Self-pay | Admitting: *Deleted

## 2013-06-29 NOTE — Telephone Encounter (Signed)
Inda Castle, MD Lowry Ram, RN            Sorry, followup colonoscopy should be 3 years from her last exam which was 2012.      Previous Messages      ----- Message -----  From: Lowry Ram, RN  Sent: 06/29/2013 3:06 PM  To: Inda Castle, MD  Subject: recall due now?   Dr Deatra Ina: pt is scheduled for recall colon 07/16/13. Last colonoscopy 2012 serrated adenoma. Procedure report and letter to pt say recall in 2015. Pt had OV with you 09/04/12 and you wrote recall 2017. Please advise as to when pt is due for recall. Thanks, [employer]

## 2013-07-03 ENCOUNTER — Ambulatory Visit (AMBULATORY_SURGERY_CENTER): Payer: Self-pay

## 2013-07-03 VITALS — Ht 66.0 in | Wt 222.4 lb

## 2013-07-03 DIAGNOSIS — Z86010 Personal history of colonic polyps: Secondary | ICD-10-CM

## 2013-07-03 DIAGNOSIS — Z800 Family history of malignant neoplasm of digestive organs: Secondary | ICD-10-CM

## 2013-07-03 MED ORDER — NA SULFATE-K SULFATE-MG SULF 17.5-3.13-1.6 GM/177ML PO SOLN: ORAL | Status: DC

## 2013-07-10 ENCOUNTER — Encounter: Payer: Self-pay | Admitting: Gastroenterology

## 2013-07-16 ENCOUNTER — Encounter: Payer: Self-pay | Admitting: Gastroenterology

## 2013-07-16 ENCOUNTER — Encounter: Payer: BC Managed Care – PPO | Admitting: Gastroenterology

## 2013-07-16 ENCOUNTER — Ambulatory Visit (AMBULATORY_SURGERY_CENTER): Payer: BC Managed Care – PPO | Admitting: Gastroenterology

## 2013-07-16 VITALS — BP 122/83 | HR 78 | Temp 98.8°F | Resp 23 | Ht 66.0 in | Wt 222.0 lb

## 2013-07-16 DIAGNOSIS — D126 Benign neoplasm of colon, unspecified: Secondary | ICD-10-CM

## 2013-07-16 DIAGNOSIS — Z86010 Personal history of colonic polyps: Secondary | ICD-10-CM

## 2013-07-16 MED ORDER — SODIUM CHLORIDE 0.9 % IV SOLN: 500.0000 mL | INTRAVENOUS | Status: DC

## 2013-07-16 NOTE — Progress Notes (Signed)
Procedure ends, to recovery, report given and VSS. 

## 2013-07-16 NOTE — Progress Notes (Signed)
Called to room to assist during endoscopic procedure.  Patient ID and intended procedure confirmed with present staff. Received instructions for my participation in the procedure from the performing physician.  

## 2013-07-16 NOTE — Op Note (Signed)
[facility]  [employer] [place], [address]   COLONOSCOPY PROCEDURE REPORT  PATIENT: [name], [name]  MR#: [id number] BIRTHDATE: May 26, 1971 , 20  yrs. old GENDER: Female ENDOSCOPIST: Inda Castle, MD REFERRED EL:[id number] Inda Merlin, M.D. PROCEDURE DATE:  07/16/2013 PROCEDURE:   Colonoscopy with snare polypectomy First Screening Colonoscopy - Avg.  risk and is 50 yrs.  old or older - No.  Prior Negative Screening - Now for repeat screening. N/A  History of Adenoma - Now for follow-up colonoscopy & has been > or = to 3 yrs.  Yes hx of adenoma.  Has been 3 or more years since last colonoscopy.  Polyps Removed Today? Yes. ASA CLASS:   Class II INDICATIONS:Patient's personal history of adenomatous colon polyps 2012 MEDICATIONS: MAC sedation, administered by CRNA and propofol (Diprivan) 250mg  IV  DESCRIPTION OF PROCEDURE:   After the risks benefits and alternatives of the procedure were thoroughly explained, informed consent was obtained.  A digital rectal exam revealed no abnormalities of the rectum.   The LB [id number]  endoscope was introduced through the anus and advanced to the cecum, which was identified by both the appendix and ileocecal valve. No adverse events experienced.   The quality of the prep was Suprep good  The instrument was then slowly withdrawn as the colon was fully examined.      COLON FINDINGS: A sessile polyp measuring 8 mm in size was found in the descending colon.  A polypectomy was performed using snare cautery.  The resection was complete and the polyp tissue was completely retrieved.   Internal hemorrhoids were found.   The colon mucosa was otherwise normal.  Retroflexed views revealed no abnormalities. The time to cecum=3 minutes 53 seconds.  Withdrawal time=9 minutes 45 seconds.  The scope was withdrawn and the procedure completed. COMPLICATIONS: There were no complications.  ENDOSCOPIC IMPRESSION: 1.   Sessile polyp  measuring 8 mm in size was found in the descending colon; polypectomy was performed using snare cautery 2.   Internal hemorrhoids 3.   The colon mucosa was otherwise normal  RECOMMENDATIONS: If the polyp(s) removed today are proven to be adenomatous (pre-cancerous) polyps, you will need a repeat colonoscopy in 5 years.  Otherwise you should continue to follow colorectal cancer screening guidelines for "routine risk" patients with colonoscopy in 10 years.  You will receive a letter within 1-2 weeks with the results of your biopsy as well as final recommendations.  Please call my office if you have not received a letter after 3 weeks.   eSigned:  Inda Castle, MD 07/16/2013 10:49 AM   cc:   PATIENT NAME:  [name], [name] MR#: [id number]

## 2013-07-16 NOTE — Patient Instructions (Signed)
YOU HAD AN ENDOSCOPIC PROCEDURE TODAY AT THE [facility]: Refer to the procedure report that was given to you for any specific questions about what was found during the examination.  If the procedure report does not answer your questions, please call your gastroenterologist to clarify.  If you requested that your care partner not be given the details of your procedure findings, then the procedure report has been included in a sealed envelope for you to review at your convenience later.  YOU SHOULD EXPECT: Some feelings of bloating in the abdomen. Passage of more gas than usual.  Walking can help get rid of the air that was put into your GI tract during the procedure and reduce the bloating. If you had a lower endoscopy (such as a colonoscopy or flexible sigmoidoscopy) you may notice spotting of blood in your stool or on the toilet paper. If you underwent a bowel prep for your procedure, then you may not have a normal bowel movement for a few days.  DIET: Your first meal following the procedure should be a light meal and then it is ok to progress to your normal diet.  A half-sandwich or bowl of soup is an example of a good first meal.  Heavy or fried foods are harder to digest and may make you feel nauseous or bloated.  Likewise meals heavy in dairy and vegetables can cause extra gas to form and this can also increase the bloating.  Drink plenty of fluids but you should avoid alcoholic beverages for 24 hours.  ACTIVITY: Your care partner should take you home directly after the procedure.  You should plan to take it easy, moving slowly for the rest of the day.  You can resume normal activity the day after the procedure however you should NOT DRIVE or use heavy machinery for 24 hours (because of the sedation medicines used during the test).    SYMPTOMS TO REPORT IMMEDIATELY: A gastroenterologist can be reached at any hour.  During normal business hours, 8:30 AM to 5:00 PM Monday through Friday,  call [phone].  After hours and on weekends, please call the GI answering service at [phone] who will take a message and have the physician on call contact you.   Following lower endoscopy (colonoscopy or flexible sigmoidoscopy):  Excessive amounts of blood in the stool  Significant tenderness or worsening of abdominal pains  Swelling of the abdomen that is new, acute  Fever of 100F or higher    FOLLOW UP: If any biopsies were taken you will be contacted by phone or by letter within the next 1-3 weeks.  Call your gastroenterologist if you have not heard about the biopsies in 3 weeks.  Our staff will call the home number listed on your records the next business day following your procedure to check on you and address any questions or concerns that you may have at that time regarding the information given to you following your procedure. This is a courtesy call and so if there is no answer at the home number and we have not heard from you through the emergency physician on call, we will assume that you have returned to your regular daily activities without incident.  SIGNATURES/CONFIDENTIALITY: You and/or your care partner have signed paperwork which will be entered into your electronic medical record.  These signatures attest to the fact that that the information above on your After Visit Summary has been reviewed and is understood.  Full responsibility of the confidentiality   this discharge information lies with you and/or your care-partner.  Polyp and hemorrhoid information given. 

## 2013-07-17 ENCOUNTER — Telehealth: Payer: Self-pay

## 2013-07-17 NOTE — Telephone Encounter (Signed)
No answer, left vm to call [facility] if questions or concerns following procedure Monday.  

## 2013-07-20 ENCOUNTER — Encounter: Payer: Self-pay | Admitting: Gastroenterology

## 2013-07-26 ENCOUNTER — Telehealth: Payer: Self-pay | Admitting: Gastroenterology

## 2013-07-26 NOTE — Telephone Encounter (Signed)
Left message for pt to call back.  Spoke with pt and she is aware. 

## 2013-07-26 NOTE — Telephone Encounter (Signed)
She had a large, sessile polyp in the cecum in 2012.  Normal follow for a benign polyp is 5 years.  Because of its size and sessile nature I reduced followup to 3 years.  Her most recent colonoscopy demonstrated a left colon polyp that measured less than 10 mm.  There was no recurrence of the right colon polyp.  Accordingly, I think a five-year followup interval is adequate.  If she still has questions then she can make an office appointment and I would be happy to talk with her in more detail.

## 2013-07-26 NOTE — Telephone Encounter (Signed)
Pt had a colon in 2012 and polyps were removed and per results letter were benign. Pt had a recall colon at 3 years. Pt just had colon done this month and had an adenomatous polyp. Per results letter pt is to have a recall colon in 5 years. Pt is confused as to why it is 5 years now and the last time everything was benign and she had a 3 year recall. Pt would rather have the recall in 3 years or less now due to the adenomatous polyp. Please advise.

## 2013-09-11 ENCOUNTER — Other Ambulatory Visit: Payer: Self-pay | Admitting: Dermatology

## 2013-10-16 ENCOUNTER — Other Ambulatory Visit: Payer: Self-pay | Admitting: Dermatology

## 2013-12-03 ENCOUNTER — Other Ambulatory Visit: Payer: Self-pay | Admitting: Obstetrics and Gynecology

## 2013-12-04 LAB — CYTOLOGY - PAP

## 2014-10-17 ENCOUNTER — Other Ambulatory Visit: Payer: Self-pay

## 2014-10-17 ENCOUNTER — Telehealth: Payer: Self-pay | Admitting: Gastroenterology

## 2014-10-17 MED ORDER — HYDROCORTISONE ACETATE 25 MG RE SUPP: 25.0000 mg | Freq: Two times a day (BID) | RECTAL | Status: AC

## 2014-10-17 NOTE — Telephone Encounter (Signed)
Detailed message left per patient request. Appointment set for 10/31/14 at 9:30 am.

## 2014-10-17 NOTE — Telephone Encounter (Signed)
Anusol HC suppositories.  Take one at bedtime and in the morning.  Office visit sometime in the next few weeks

## 2014-10-17 NOTE — Telephone Encounter (Signed)
She knows she has internal hemorrhoids, but she now has a protruding one. It has become sore and friable. She is using TUCKS. Painful with bowel movements. Burning and hurting when sitting. She is also out of town this week. Is there anything that can be called in for her?

## 2014-10-21 ENCOUNTER — Ambulatory Visit (INDEPENDENT_AMBULATORY_CARE_PROVIDER_SITE_OTHER): Payer: BLUE CROSS/BLUE SHIELD | Admitting: Gastroenterology

## 2014-10-21 ENCOUNTER — Encounter: Payer: Self-pay | Admitting: Gastroenterology

## 2014-10-21 VITALS — BP 148/90 | HR 76 | Ht 66.0 in | Wt 229.5 lb

## 2014-10-21 DIAGNOSIS — K645 Perianal venous thrombosis: Secondary | ICD-10-CM | POA: Insufficient documentation

## 2014-10-21 NOTE — Patient Instructions (Addendum)
Sitz Bath A sitz bath is a warm water bath taken in the sitting position that covers only the hips and buttocks. It may be used for either healing or hygiene purposes. Sitz baths are also used to relieve pain, itching, or muscle spasms. The water may contain medicine. Moist heat will help you heal and relax.  HOME CARE INSTRUCTIONS  Take 3 to 4 sitz baths a day. 1. Fill the bathtub half full with warm water. 2. Sit in the water and open the drain a little. 3. Turn on the warm water to keep the tub half full. Keep the water running constantly. 4. Soak in the water for 15 to 20 minutes. 5. After the sitz bath, pat the affected area dry first. SEEK MEDICAL CARE IF:  You get worse instead of better. Stop the sitz baths if you get worse. MAKE SURE YOU:  Understand these instructions.  Will watch your condition.  Will get help right away if you are not doing well or get worse. Document Released: 02/14/2004 Document Revised: 02/16/2012 Document Reviewed: 08/21/2010 [facility] Patient Information 2015 [place], [place]. This information is not intended to replace advice given to you by your health care provider. Make sure you discuss any questions you have with your health care provider.  You are scheduled for a 1st banding 12/30/2014 at 8:45am.   Follow up with PCP regarding blood pressure.

## 2014-10-21 NOTE — Progress Notes (Signed)
      History of Present Illness:  [name] is complaining of recurrent painful bleeding hemorrhoid.  This is despite suppositories.  Colonoscopy in 2015 demonstrated internal hemorrhoids as well as an adenomatous polyp.    Review of Systems: Pertinent positive and negative review of systems were noted in the above HPI section. All other review of systems were otherwise negative.    Current Medications, Allergies, Past Medical History, Past Surgical History, Family History and Social History were reviewed in [facility] record  Vital signs were reviewed in today's medical record. Physical Exam: General: Well developed , well nourished, no acute distress On rectal exam there is a partially thrombosed reducible hemorrhoid   See Assessment and Plan under Problem List

## 2014-10-21 NOTE — Assessment & Plan Note (Signed)
She is symptomatic from a partially thrombosed external hemorrhoid that actually is reducible.  She was instructed to use warm soaks and continue hemorrhoidal suppositories.  She will eventually admitted from band ligation of internal hemorrhoids.

## 2014-10-31 ENCOUNTER — Ambulatory Visit: Payer: Self-pay | Admitting: Gastroenterology

## 2014-12-30 ENCOUNTER — Encounter: Payer: BLUE CROSS/BLUE SHIELD | Admitting: Gastroenterology

## 2015-01-30 ENCOUNTER — Other Ambulatory Visit: Payer: Self-pay | Admitting: Obstetrics and Gynecology

## 2015-01-31 LAB — CYTOLOGY - PAP

## 2015-10-13 DIAGNOSIS — R1031 Right lower quadrant pain: Secondary | ICD-10-CM | POA: Diagnosis not present

## 2015-11-06 DIAGNOSIS — E7800 Pure hypercholesterolemia, unspecified: Secondary | ICD-10-CM | POA: Diagnosis not present

## 2015-11-06 DIAGNOSIS — R5383 Other fatigue: Secondary | ICD-10-CM | POA: Diagnosis not present

## 2015-11-06 DIAGNOSIS — E559 Vitamin D deficiency, unspecified: Secondary | ICD-10-CM | POA: Diagnosis not present

## 2015-11-06 DIAGNOSIS — E1165 Type 2 diabetes mellitus with hyperglycemia: Secondary | ICD-10-CM | POA: Diagnosis not present

## 2015-11-12 DIAGNOSIS — E559 Vitamin D deficiency, unspecified: Secondary | ICD-10-CM | POA: Diagnosis not present

## 2015-11-12 DIAGNOSIS — E1165 Type 2 diabetes mellitus with hyperglycemia: Secondary | ICD-10-CM | POA: Diagnosis not present

## 2015-11-12 DIAGNOSIS — E7800 Pure hypercholesterolemia, unspecified: Secondary | ICD-10-CM | POA: Diagnosis not present

## 2015-11-12 DIAGNOSIS — I10 Essential (primary) hypertension: Secondary | ICD-10-CM | POA: Diagnosis not present

## 2015-11-24 DIAGNOSIS — D2361 Other benign neoplasm of skin of right upper limb, including shoulder: Secondary | ICD-10-CM | POA: Diagnosis not present

## 2015-11-24 DIAGNOSIS — D224 Melanocytic nevi of scalp and neck: Secondary | ICD-10-CM | POA: Diagnosis not present

## 2015-11-24 DIAGNOSIS — D2262 Melanocytic nevi of left upper limb, including shoulder: Secondary | ICD-10-CM | POA: Diagnosis not present

## 2015-11-24 DIAGNOSIS — D2261 Melanocytic nevi of right upper limb, including shoulder: Secondary | ICD-10-CM | POA: Diagnosis not present

## 2016-01-13 DIAGNOSIS — E6609 Other obesity due to excess calories: Secondary | ICD-10-CM | POA: Diagnosis not present

## 2016-01-13 DIAGNOSIS — E7800 Pure hypercholesterolemia, unspecified: Secondary | ICD-10-CM | POA: Diagnosis not present

## 2016-01-13 DIAGNOSIS — K529 Noninfective gastroenteritis and colitis, unspecified: Secondary | ICD-10-CM | POA: Diagnosis not present

## 2016-01-13 DIAGNOSIS — I10 Essential (primary) hypertension: Secondary | ICD-10-CM | POA: Diagnosis not present

## 2016-02-20 DIAGNOSIS — Z6835 Body mass index (BMI) 35.0-35.9, adult: Secondary | ICD-10-CM | POA: Diagnosis not present

## 2016-02-20 DIAGNOSIS — Z01419 Encounter for gynecological examination (general) (routine) without abnormal findings: Secondary | ICD-10-CM | POA: Diagnosis not present

## 2016-02-20 DIAGNOSIS — Z1231 Encounter for screening mammogram for malignant neoplasm of breast: Secondary | ICD-10-CM | POA: Diagnosis not present

## 2016-03-10 DIAGNOSIS — M542 Cervicalgia: Secondary | ICD-10-CM | POA: Diagnosis not present

## 2016-03-10 DIAGNOSIS — M25511 Pain in right shoulder: Secondary | ICD-10-CM | POA: Diagnosis not present

## 2016-05-13 DIAGNOSIS — F4320 Adjustment disorder, unspecified: Secondary | ICD-10-CM | POA: Diagnosis not present

## 2016-05-18 DIAGNOSIS — Z23 Encounter for immunization: Secondary | ICD-10-CM | POA: Diagnosis not present

## 2016-06-08 DIAGNOSIS — F4320 Adjustment disorder, unspecified: Secondary | ICD-10-CM | POA: Diagnosis not present

## 2016-06-16 DIAGNOSIS — E559 Vitamin D deficiency, unspecified: Secondary | ICD-10-CM | POA: Diagnosis not present

## 2016-06-16 DIAGNOSIS — E1165 Type 2 diabetes mellitus with hyperglycemia: Secondary | ICD-10-CM | POA: Diagnosis not present

## 2016-06-16 DIAGNOSIS — E7800 Pure hypercholesterolemia, unspecified: Secondary | ICD-10-CM | POA: Diagnosis not present

## 2016-06-17 DIAGNOSIS — F4320 Adjustment disorder, unspecified: Secondary | ICD-10-CM | POA: Diagnosis not present

## 2016-06-29 DIAGNOSIS — F4320 Adjustment disorder, unspecified: Secondary | ICD-10-CM | POA: Diagnosis not present

## 2016-07-08 DIAGNOSIS — J069 Acute upper respiratory infection, unspecified: Secondary | ICD-10-CM | POA: Diagnosis not present

## 2016-07-11 DIAGNOSIS — R05 Cough: Secondary | ICD-10-CM | POA: Diagnosis not present

## 2016-07-11 DIAGNOSIS — J069 Acute upper respiratory infection, unspecified: Secondary | ICD-10-CM | POA: Diagnosis not present

## 2016-07-19 DIAGNOSIS — M542 Cervicalgia: Secondary | ICD-10-CM | POA: Diagnosis not present

## 2016-07-20 DIAGNOSIS — F4320 Adjustment disorder, unspecified: Secondary | ICD-10-CM | POA: Diagnosis not present

## 2016-07-27 DIAGNOSIS — F4320 Adjustment disorder, unspecified: Secondary | ICD-10-CM | POA: Diagnosis not present

## 2016-07-28 DIAGNOSIS — M542 Cervicalgia: Secondary | ICD-10-CM | POA: Diagnosis not present

## 2016-07-30 DIAGNOSIS — J301 Allergic rhinitis due to pollen: Secondary | ICD-10-CM | POA: Diagnosis not present

## 2016-07-30 DIAGNOSIS — I10 Essential (primary) hypertension: Secondary | ICD-10-CM | POA: Diagnosis not present

## 2016-08-09 DIAGNOSIS — M542 Cervicalgia: Secondary | ICD-10-CM | POA: Diagnosis not present

## 2016-08-10 DIAGNOSIS — F4320 Adjustment disorder, unspecified: Secondary | ICD-10-CM | POA: Diagnosis not present

## 2016-08-23 DIAGNOSIS — M542 Cervicalgia: Secondary | ICD-10-CM | POA: Diagnosis not present

## 2016-08-26 DIAGNOSIS — F4320 Adjustment disorder, unspecified: Secondary | ICD-10-CM | POA: Diagnosis not present

## 2016-09-01 DIAGNOSIS — T781XXA Other adverse food reactions, not elsewhere classified, initial encounter: Secondary | ICD-10-CM | POA: Diagnosis not present

## 2016-09-01 DIAGNOSIS — J301 Allergic rhinitis due to pollen: Secondary | ICD-10-CM | POA: Diagnosis not present

## 2016-09-01 DIAGNOSIS — J3089 Other allergic rhinitis: Secondary | ICD-10-CM | POA: Diagnosis not present

## 2016-09-01 DIAGNOSIS — R05 Cough: Secondary | ICD-10-CM | POA: Diagnosis not present

## 2016-09-02 DIAGNOSIS — M542 Cervicalgia: Secondary | ICD-10-CM | POA: Diagnosis not present

## 2016-09-06 DIAGNOSIS — F4320 Adjustment disorder, unspecified: Secondary | ICD-10-CM | POA: Diagnosis not present

## 2016-09-07 DIAGNOSIS — J3089 Other allergic rhinitis: Secondary | ICD-10-CM | POA: Diagnosis not present

## 2016-09-07 DIAGNOSIS — J3081 Allergic rhinitis due to animal (cat) (dog) hair and dander: Secondary | ICD-10-CM | POA: Diagnosis not present

## 2016-09-07 DIAGNOSIS — J301 Allergic rhinitis due to pollen: Secondary | ICD-10-CM | POA: Diagnosis not present

## 2016-09-08 DIAGNOSIS — J3081 Allergic rhinitis due to animal (cat) (dog) hair and dander: Secondary | ICD-10-CM | POA: Diagnosis not present

## 2016-09-08 DIAGNOSIS — J3089 Other allergic rhinitis: Secondary | ICD-10-CM | POA: Diagnosis not present

## 2016-09-08 DIAGNOSIS — I10 Essential (primary) hypertension: Secondary | ICD-10-CM | POA: Diagnosis not present

## 2016-09-08 DIAGNOSIS — E1165 Type 2 diabetes mellitus with hyperglycemia: Secondary | ICD-10-CM | POA: Diagnosis not present

## 2016-09-08 DIAGNOSIS — E7800 Pure hypercholesterolemia, unspecified: Secondary | ICD-10-CM | POA: Diagnosis not present

## 2016-09-08 DIAGNOSIS — J301 Allergic rhinitis due to pollen: Secondary | ICD-10-CM | POA: Diagnosis not present

## 2016-09-08 DIAGNOSIS — E559 Vitamin D deficiency, unspecified: Secondary | ICD-10-CM | POA: Diagnosis not present

## 2016-09-15 DIAGNOSIS — J3081 Allergic rhinitis due to animal (cat) (dog) hair and dander: Secondary | ICD-10-CM | POA: Diagnosis not present

## 2016-09-15 DIAGNOSIS — J301 Allergic rhinitis due to pollen: Secondary | ICD-10-CM | POA: Diagnosis not present

## 2016-09-15 DIAGNOSIS — J3089 Other allergic rhinitis: Secondary | ICD-10-CM | POA: Diagnosis not present

## 2016-09-21 DIAGNOSIS — J3081 Allergic rhinitis due to animal (cat) (dog) hair and dander: Secondary | ICD-10-CM | POA: Diagnosis not present

## 2016-09-21 DIAGNOSIS — J301 Allergic rhinitis due to pollen: Secondary | ICD-10-CM | POA: Diagnosis not present

## 2016-09-21 DIAGNOSIS — J3089 Other allergic rhinitis: Secondary | ICD-10-CM | POA: Diagnosis not present

## 2016-09-22 DIAGNOSIS — F4320 Adjustment disorder, unspecified: Secondary | ICD-10-CM | POA: Diagnosis not present

## 2016-09-28 DIAGNOSIS — J3081 Allergic rhinitis due to animal (cat) (dog) hair and dander: Secondary | ICD-10-CM | POA: Diagnosis not present

## 2016-09-28 DIAGNOSIS — J3089 Other allergic rhinitis: Secondary | ICD-10-CM | POA: Diagnosis not present

## 2016-09-28 DIAGNOSIS — J301 Allergic rhinitis due to pollen: Secondary | ICD-10-CM | POA: Diagnosis not present

## 2016-09-30 DIAGNOSIS — J3081 Allergic rhinitis due to animal (cat) (dog) hair and dander: Secondary | ICD-10-CM | POA: Diagnosis not present

## 2016-09-30 DIAGNOSIS — J3089 Other allergic rhinitis: Secondary | ICD-10-CM | POA: Diagnosis not present

## 2016-09-30 DIAGNOSIS — J301 Allergic rhinitis due to pollen: Secondary | ICD-10-CM | POA: Diagnosis not present

## 2016-10-05 DIAGNOSIS — J3089 Other allergic rhinitis: Secondary | ICD-10-CM | POA: Diagnosis not present

## 2016-10-05 DIAGNOSIS — J3081 Allergic rhinitis due to animal (cat) (dog) hair and dander: Secondary | ICD-10-CM | POA: Diagnosis not present

## 2016-10-05 DIAGNOSIS — J301 Allergic rhinitis due to pollen: Secondary | ICD-10-CM | POA: Diagnosis not present

## 2016-10-07 DIAGNOSIS — J3089 Other allergic rhinitis: Secondary | ICD-10-CM | POA: Diagnosis not present

## 2016-10-07 DIAGNOSIS — J3081 Allergic rhinitis due to animal (cat) (dog) hair and dander: Secondary | ICD-10-CM | POA: Diagnosis not present

## 2016-10-07 DIAGNOSIS — J301 Allergic rhinitis due to pollen: Secondary | ICD-10-CM | POA: Diagnosis not present

## 2016-10-13 DIAGNOSIS — F4320 Adjustment disorder, unspecified: Secondary | ICD-10-CM | POA: Diagnosis not present

## 2016-10-14 DIAGNOSIS — M25511 Pain in right shoulder: Secondary | ICD-10-CM | POA: Diagnosis not present

## 2016-10-14 DIAGNOSIS — J3081 Allergic rhinitis due to animal (cat) (dog) hair and dander: Secondary | ICD-10-CM | POA: Diagnosis not present

## 2016-10-14 DIAGNOSIS — G8929 Other chronic pain: Secondary | ICD-10-CM | POA: Diagnosis not present

## 2016-10-14 DIAGNOSIS — J301 Allergic rhinitis due to pollen: Secondary | ICD-10-CM | POA: Diagnosis not present

## 2016-10-14 DIAGNOSIS — J3089 Other allergic rhinitis: Secondary | ICD-10-CM | POA: Diagnosis not present

## 2016-10-14 DIAGNOSIS — M542 Cervicalgia: Secondary | ICD-10-CM | POA: Diagnosis not present

## 2016-10-21 DIAGNOSIS — J301 Allergic rhinitis due to pollen: Secondary | ICD-10-CM | POA: Diagnosis not present

## 2016-10-21 DIAGNOSIS — J3089 Other allergic rhinitis: Secondary | ICD-10-CM | POA: Diagnosis not present

## 2016-10-21 DIAGNOSIS — J3081 Allergic rhinitis due to animal (cat) (dog) hair and dander: Secondary | ICD-10-CM | POA: Diagnosis not present

## 2016-10-26 DIAGNOSIS — J3081 Allergic rhinitis due to animal (cat) (dog) hair and dander: Secondary | ICD-10-CM | POA: Diagnosis not present

## 2016-10-26 DIAGNOSIS — J301 Allergic rhinitis due to pollen: Secondary | ICD-10-CM | POA: Diagnosis not present

## 2016-10-26 DIAGNOSIS — J3089 Other allergic rhinitis: Secondary | ICD-10-CM | POA: Diagnosis not present

## 2016-10-28 DIAGNOSIS — J3089 Other allergic rhinitis: Secondary | ICD-10-CM | POA: Diagnosis not present

## 2016-11-09 DIAGNOSIS — J3089 Other allergic rhinitis: Secondary | ICD-10-CM | POA: Diagnosis not present

## 2016-11-09 DIAGNOSIS — J3081 Allergic rhinitis due to animal (cat) (dog) hair and dander: Secondary | ICD-10-CM | POA: Diagnosis not present

## 2016-11-09 DIAGNOSIS — J301 Allergic rhinitis due to pollen: Secondary | ICD-10-CM | POA: Diagnosis not present

## 2016-11-24 DIAGNOSIS — F4322 Adjustment disorder with anxiety: Secondary | ICD-10-CM | POA: Diagnosis not present

## 2016-11-29 DIAGNOSIS — J3089 Other allergic rhinitis: Secondary | ICD-10-CM | POA: Diagnosis not present

## 2016-11-29 DIAGNOSIS — J301 Allergic rhinitis due to pollen: Secondary | ICD-10-CM | POA: Diagnosis not present

## 2016-11-29 DIAGNOSIS — J3081 Allergic rhinitis due to animal (cat) (dog) hair and dander: Secondary | ICD-10-CM | POA: Diagnosis not present

## 2016-12-02 DIAGNOSIS — J301 Allergic rhinitis due to pollen: Secondary | ICD-10-CM | POA: Diagnosis not present

## 2016-12-02 DIAGNOSIS — J3081 Allergic rhinitis due to animal (cat) (dog) hair and dander: Secondary | ICD-10-CM | POA: Diagnosis not present

## 2016-12-02 DIAGNOSIS — J3089 Other allergic rhinitis: Secondary | ICD-10-CM | POA: Diagnosis not present

## 2016-12-07 DIAGNOSIS — J301 Allergic rhinitis due to pollen: Secondary | ICD-10-CM | POA: Diagnosis not present

## 2016-12-07 DIAGNOSIS — J3081 Allergic rhinitis due to animal (cat) (dog) hair and dander: Secondary | ICD-10-CM | POA: Diagnosis not present

## 2016-12-07 DIAGNOSIS — J3089 Other allergic rhinitis: Secondary | ICD-10-CM | POA: Diagnosis not present

## 2016-12-18 DIAGNOSIS — M25472 Effusion, left ankle: Secondary | ICD-10-CM | POA: Diagnosis not present

## 2016-12-18 DIAGNOSIS — S93402A Sprain of unspecified ligament of left ankle, initial encounter: Secondary | ICD-10-CM | POA: Diagnosis not present

## 2016-12-18 DIAGNOSIS — W1843XA Slipping, tripping and stumbling without falling due to stepping from one level to another, initial encounter: Secondary | ICD-10-CM | POA: Diagnosis not present

## 2017-01-04 DIAGNOSIS — J3089 Other allergic rhinitis: Secondary | ICD-10-CM | POA: Diagnosis not present

## 2017-01-04 DIAGNOSIS — J301 Allergic rhinitis due to pollen: Secondary | ICD-10-CM | POA: Diagnosis not present

## 2017-01-04 DIAGNOSIS — J3081 Allergic rhinitis due to animal (cat) (dog) hair and dander: Secondary | ICD-10-CM | POA: Diagnosis not present

## 2017-01-06 DIAGNOSIS — J3081 Allergic rhinitis due to animal (cat) (dog) hair and dander: Secondary | ICD-10-CM | POA: Diagnosis not present

## 2017-01-06 DIAGNOSIS — J301 Allergic rhinitis due to pollen: Secondary | ICD-10-CM | POA: Diagnosis not present

## 2017-01-06 DIAGNOSIS — J3089 Other allergic rhinitis: Secondary | ICD-10-CM | POA: Diagnosis not present

## 2017-01-31 DIAGNOSIS — I10 Essential (primary) hypertension: Secondary | ICD-10-CM | POA: Diagnosis not present

## 2017-02-15 DIAGNOSIS — J3089 Other allergic rhinitis: Secondary | ICD-10-CM | POA: Diagnosis not present

## 2017-02-15 DIAGNOSIS — J3081 Allergic rhinitis due to animal (cat) (dog) hair and dander: Secondary | ICD-10-CM | POA: Diagnosis not present

## 2017-02-15 DIAGNOSIS — J301 Allergic rhinitis due to pollen: Secondary | ICD-10-CM | POA: Diagnosis not present

## 2017-02-17 DIAGNOSIS — J3081 Allergic rhinitis due to animal (cat) (dog) hair and dander: Secondary | ICD-10-CM | POA: Diagnosis not present

## 2017-02-17 DIAGNOSIS — J3089 Other allergic rhinitis: Secondary | ICD-10-CM | POA: Diagnosis not present

## 2017-02-17 DIAGNOSIS — J301 Allergic rhinitis due to pollen: Secondary | ICD-10-CM | POA: Diagnosis not present

## 2017-02-18 DIAGNOSIS — D224 Melanocytic nevi of scalp and neck: Secondary | ICD-10-CM | POA: Diagnosis not present

## 2017-02-18 DIAGNOSIS — L814 Other melanin hyperpigmentation: Secondary | ICD-10-CM | POA: Diagnosis not present

## 2017-02-18 DIAGNOSIS — L245 Irritant contact dermatitis due to other chemical products: Secondary | ICD-10-CM | POA: Diagnosis not present

## 2017-02-18 DIAGNOSIS — D2262 Melanocytic nevi of left upper limb, including shoulder: Secondary | ICD-10-CM | POA: Diagnosis not present

## 2017-02-21 DIAGNOSIS — Z1231 Encounter for screening mammogram for malignant neoplasm of breast: Secondary | ICD-10-CM | POA: Diagnosis not present

## 2017-02-21 DIAGNOSIS — N951 Menopausal and female climacteric states: Secondary | ICD-10-CM | POA: Diagnosis not present

## 2017-02-21 DIAGNOSIS — Z6835 Body mass index (BMI) 35.0-35.9, adult: Secondary | ICD-10-CM | POA: Diagnosis not present

## 2017-02-21 DIAGNOSIS — Z01419 Encounter for gynecological examination (general) (routine) without abnormal findings: Secondary | ICD-10-CM | POA: Diagnosis not present

## 2017-02-21 DIAGNOSIS — Z1212 Encounter for screening for malignant neoplasm of rectum: Secondary | ICD-10-CM | POA: Diagnosis not present

## 2017-03-01 DIAGNOSIS — J3081 Allergic rhinitis due to animal (cat) (dog) hair and dander: Secondary | ICD-10-CM | POA: Diagnosis not present

## 2017-03-01 DIAGNOSIS — J3089 Other allergic rhinitis: Secondary | ICD-10-CM | POA: Diagnosis not present

## 2017-03-01 DIAGNOSIS — J301 Allergic rhinitis due to pollen: Secondary | ICD-10-CM | POA: Diagnosis not present

## 2017-03-03 DIAGNOSIS — E559 Vitamin D deficiency, unspecified: Secondary | ICD-10-CM | POA: Diagnosis not present

## 2017-03-03 DIAGNOSIS — E1165 Type 2 diabetes mellitus with hyperglycemia: Secondary | ICD-10-CM | POA: Diagnosis not present

## 2017-03-03 DIAGNOSIS — E7800 Pure hypercholesterolemia, unspecified: Secondary | ICD-10-CM | POA: Diagnosis not present

## 2017-03-08 DIAGNOSIS — Z23 Encounter for immunization: Secondary | ICD-10-CM | POA: Diagnosis not present

## 2017-03-08 DIAGNOSIS — Z780 Asymptomatic menopausal state: Secondary | ICD-10-CM | POA: Diagnosis not present

## 2017-03-10 DIAGNOSIS — J3081 Allergic rhinitis due to animal (cat) (dog) hair and dander: Secondary | ICD-10-CM | POA: Diagnosis not present

## 2017-03-10 DIAGNOSIS — J301 Allergic rhinitis due to pollen: Secondary | ICD-10-CM | POA: Diagnosis not present

## 2017-03-10 DIAGNOSIS — E7800 Pure hypercholesterolemia, unspecified: Secondary | ICD-10-CM | POA: Diagnosis not present

## 2017-03-10 DIAGNOSIS — J3089 Other allergic rhinitis: Secondary | ICD-10-CM | POA: Diagnosis not present

## 2017-03-10 DIAGNOSIS — I10 Essential (primary) hypertension: Secondary | ICD-10-CM | POA: Diagnosis not present

## 2017-03-10 DIAGNOSIS — E559 Vitamin D deficiency, unspecified: Secondary | ICD-10-CM | POA: Diagnosis not present

## 2017-03-10 DIAGNOSIS — E1165 Type 2 diabetes mellitus with hyperglycemia: Secondary | ICD-10-CM | POA: Diagnosis not present

## 2017-03-11 DIAGNOSIS — J3081 Allergic rhinitis due to animal (cat) (dog) hair and dander: Secondary | ICD-10-CM | POA: Diagnosis not present

## 2017-03-11 DIAGNOSIS — J301 Allergic rhinitis due to pollen: Secondary | ICD-10-CM | POA: Diagnosis not present

## 2017-03-11 DIAGNOSIS — J3089 Other allergic rhinitis: Secondary | ICD-10-CM | POA: Diagnosis not present

## 2017-03-22 DIAGNOSIS — J301 Allergic rhinitis due to pollen: Secondary | ICD-10-CM | POA: Diagnosis not present

## 2017-03-22 DIAGNOSIS — J3081 Allergic rhinitis due to animal (cat) (dog) hair and dander: Secondary | ICD-10-CM | POA: Diagnosis not present

## 2017-03-22 DIAGNOSIS — J3089 Other allergic rhinitis: Secondary | ICD-10-CM | POA: Diagnosis not present

## 2017-03-29 DIAGNOSIS — J3081 Allergic rhinitis due to animal (cat) (dog) hair and dander: Secondary | ICD-10-CM | POA: Diagnosis not present

## 2017-03-29 DIAGNOSIS — J301 Allergic rhinitis due to pollen: Secondary | ICD-10-CM | POA: Diagnosis not present

## 2017-03-29 DIAGNOSIS — J3089 Other allergic rhinitis: Secondary | ICD-10-CM | POA: Diagnosis not present

## 2017-04-05 DIAGNOSIS — J3081 Allergic rhinitis due to animal (cat) (dog) hair and dander: Secondary | ICD-10-CM | POA: Diagnosis not present

## 2017-04-05 DIAGNOSIS — J3089 Other allergic rhinitis: Secondary | ICD-10-CM | POA: Diagnosis not present

## 2017-04-05 DIAGNOSIS — J301 Allergic rhinitis due to pollen: Secondary | ICD-10-CM | POA: Diagnosis not present

## 2017-04-11 DIAGNOSIS — J3081 Allergic rhinitis due to animal (cat) (dog) hair and dander: Secondary | ICD-10-CM | POA: Diagnosis not present

## 2017-04-11 DIAGNOSIS — T781XXA Other adverse food reactions, not elsewhere classified, initial encounter: Secondary | ICD-10-CM | POA: Diagnosis not present

## 2017-04-11 DIAGNOSIS — J3089 Other allergic rhinitis: Secondary | ICD-10-CM | POA: Diagnosis not present

## 2017-04-11 DIAGNOSIS — J301 Allergic rhinitis due to pollen: Secondary | ICD-10-CM | POA: Diagnosis not present

## 2017-04-11 DIAGNOSIS — R05 Cough: Secondary | ICD-10-CM | POA: Diagnosis not present

## 2017-04-18 DIAGNOSIS — K1329 Other disturbances of oral epithelium, including tongue: Secondary | ICD-10-CM | POA: Diagnosis not present

## 2017-04-19 DIAGNOSIS — J3081 Allergic rhinitis due to animal (cat) (dog) hair and dander: Secondary | ICD-10-CM | POA: Diagnosis not present

## 2017-04-19 DIAGNOSIS — J3089 Other allergic rhinitis: Secondary | ICD-10-CM | POA: Diagnosis not present

## 2017-04-19 DIAGNOSIS — J301 Allergic rhinitis due to pollen: Secondary | ICD-10-CM | POA: Diagnosis not present

## 2017-05-03 DIAGNOSIS — J3089 Other allergic rhinitis: Secondary | ICD-10-CM | POA: Diagnosis not present

## 2017-05-03 DIAGNOSIS — J3081 Allergic rhinitis due to animal (cat) (dog) hair and dander: Secondary | ICD-10-CM | POA: Diagnosis not present

## 2017-05-03 DIAGNOSIS — J301 Allergic rhinitis due to pollen: Secondary | ICD-10-CM | POA: Diagnosis not present

## 2017-05-10 DIAGNOSIS — J3089 Other allergic rhinitis: Secondary | ICD-10-CM | POA: Diagnosis not present

## 2017-05-10 DIAGNOSIS — J301 Allergic rhinitis due to pollen: Secondary | ICD-10-CM | POA: Diagnosis not present

## 2017-05-10 DIAGNOSIS — J3081 Allergic rhinitis due to animal (cat) (dog) hair and dander: Secondary | ICD-10-CM | POA: Diagnosis not present

## 2017-05-25 DIAGNOSIS — R110 Nausea: Secondary | ICD-10-CM | POA: Diagnosis not present

## 2017-06-14 DIAGNOSIS — J301 Allergic rhinitis due to pollen: Secondary | ICD-10-CM | POA: Diagnosis not present

## 2017-06-14 DIAGNOSIS — J3089 Other allergic rhinitis: Secondary | ICD-10-CM | POA: Diagnosis not present

## 2017-06-14 DIAGNOSIS — J3081 Allergic rhinitis due to animal (cat) (dog) hair and dander: Secondary | ICD-10-CM | POA: Diagnosis not present

## 2017-07-12 DIAGNOSIS — J3089 Other allergic rhinitis: Secondary | ICD-10-CM | POA: Diagnosis not present

## 2017-07-12 DIAGNOSIS — J3081 Allergic rhinitis due to animal (cat) (dog) hair and dander: Secondary | ICD-10-CM | POA: Diagnosis not present

## 2017-07-12 DIAGNOSIS — J301 Allergic rhinitis due to pollen: Secondary | ICD-10-CM | POA: Diagnosis not present

## 2017-07-20 DIAGNOSIS — L814 Other melanin hyperpigmentation: Secondary | ICD-10-CM | POA: Diagnosis not present

## 2017-07-20 DIAGNOSIS — L821 Other seborrheic keratosis: Secondary | ICD-10-CM | POA: Diagnosis not present

## 2017-07-20 DIAGNOSIS — L245 Irritant contact dermatitis due to other chemical products: Secondary | ICD-10-CM | POA: Diagnosis not present

## 2017-07-20 DIAGNOSIS — I788 Other diseases of capillaries: Secondary | ICD-10-CM | POA: Diagnosis not present

## 2017-07-26 DIAGNOSIS — J3089 Other allergic rhinitis: Secondary | ICD-10-CM | POA: Diagnosis not present

## 2017-07-26 DIAGNOSIS — J301 Allergic rhinitis due to pollen: Secondary | ICD-10-CM | POA: Diagnosis not present

## 2017-07-26 DIAGNOSIS — J3081 Allergic rhinitis due to animal (cat) (dog) hair and dander: Secondary | ICD-10-CM | POA: Diagnosis not present

## 2017-07-29 DIAGNOSIS — J3089 Other allergic rhinitis: Secondary | ICD-10-CM | POA: Diagnosis not present

## 2017-07-29 DIAGNOSIS — J301 Allergic rhinitis due to pollen: Secondary | ICD-10-CM | POA: Diagnosis not present

## 2017-07-29 DIAGNOSIS — J3081 Allergic rhinitis due to animal (cat) (dog) hair and dander: Secondary | ICD-10-CM | POA: Diagnosis not present

## 2017-08-18 DIAGNOSIS — I10 Essential (primary) hypertension: Secondary | ICD-10-CM | POA: Diagnosis not present

## 2017-08-24 DIAGNOSIS — R05 Cough: Secondary | ICD-10-CM | POA: Diagnosis not present

## 2017-09-06 DIAGNOSIS — J301 Allergic rhinitis due to pollen: Secondary | ICD-10-CM | POA: Diagnosis not present

## 2017-09-06 DIAGNOSIS — J3089 Other allergic rhinitis: Secondary | ICD-10-CM | POA: Diagnosis not present

## 2017-09-06 DIAGNOSIS — J3081 Allergic rhinitis due to animal (cat) (dog) hair and dander: Secondary | ICD-10-CM | POA: Diagnosis not present

## 2017-09-07 DIAGNOSIS — J3081 Allergic rhinitis due to animal (cat) (dog) hair and dander: Secondary | ICD-10-CM | POA: Diagnosis not present

## 2017-09-07 DIAGNOSIS — J301 Allergic rhinitis due to pollen: Secondary | ICD-10-CM | POA: Diagnosis not present

## 2017-09-08 DIAGNOSIS — J3089 Other allergic rhinitis: Secondary | ICD-10-CM | POA: Diagnosis not present

## 2017-09-12 DIAGNOSIS — J301 Allergic rhinitis due to pollen: Secondary | ICD-10-CM | POA: Diagnosis not present

## 2017-09-12 DIAGNOSIS — J3081 Allergic rhinitis due to animal (cat) (dog) hair and dander: Secondary | ICD-10-CM | POA: Diagnosis not present

## 2017-09-12 DIAGNOSIS — J3089 Other allergic rhinitis: Secondary | ICD-10-CM | POA: Diagnosis not present

## 2017-09-15 DIAGNOSIS — J301 Allergic rhinitis due to pollen: Secondary | ICD-10-CM | POA: Diagnosis not present

## 2017-09-15 DIAGNOSIS — J3081 Allergic rhinitis due to animal (cat) (dog) hair and dander: Secondary | ICD-10-CM | POA: Diagnosis not present

## 2017-09-15 DIAGNOSIS — J3089 Other allergic rhinitis: Secondary | ICD-10-CM | POA: Diagnosis not present

## 2017-09-21 DIAGNOSIS — J3089 Other allergic rhinitis: Secondary | ICD-10-CM | POA: Diagnosis not present

## 2017-09-21 DIAGNOSIS — J3081 Allergic rhinitis due to animal (cat) (dog) hair and dander: Secondary | ICD-10-CM | POA: Diagnosis not present

## 2017-09-21 DIAGNOSIS — J301 Allergic rhinitis due to pollen: Secondary | ICD-10-CM | POA: Diagnosis not present

## 2017-09-27 DIAGNOSIS — J3081 Allergic rhinitis due to animal (cat) (dog) hair and dander: Secondary | ICD-10-CM | POA: Diagnosis not present

## 2017-09-27 DIAGNOSIS — J3089 Other allergic rhinitis: Secondary | ICD-10-CM | POA: Diagnosis not present

## 2017-09-27 DIAGNOSIS — J301 Allergic rhinitis due to pollen: Secondary | ICD-10-CM | POA: Diagnosis not present

## 2017-09-29 DIAGNOSIS — J301 Allergic rhinitis due to pollen: Secondary | ICD-10-CM | POA: Diagnosis not present

## 2017-09-29 DIAGNOSIS — J3081 Allergic rhinitis due to animal (cat) (dog) hair and dander: Secondary | ICD-10-CM | POA: Diagnosis not present

## 2017-09-29 DIAGNOSIS — J3089 Other allergic rhinitis: Secondary | ICD-10-CM | POA: Diagnosis not present

## 2017-10-04 DIAGNOSIS — J3081 Allergic rhinitis due to animal (cat) (dog) hair and dander: Secondary | ICD-10-CM | POA: Diagnosis not present

## 2017-10-04 DIAGNOSIS — J301 Allergic rhinitis due to pollen: Secondary | ICD-10-CM | POA: Diagnosis not present

## 2017-10-04 DIAGNOSIS — J3089 Other allergic rhinitis: Secondary | ICD-10-CM | POA: Diagnosis not present

## 2017-10-11 DIAGNOSIS — J3089 Other allergic rhinitis: Secondary | ICD-10-CM | POA: Diagnosis not present

## 2017-10-11 DIAGNOSIS — J301 Allergic rhinitis due to pollen: Secondary | ICD-10-CM | POA: Diagnosis not present

## 2017-10-11 DIAGNOSIS — J3081 Allergic rhinitis due to animal (cat) (dog) hair and dander: Secondary | ICD-10-CM | POA: Diagnosis not present

## 2017-10-13 DIAGNOSIS — J301 Allergic rhinitis due to pollen: Secondary | ICD-10-CM | POA: Diagnosis not present

## 2017-10-13 DIAGNOSIS — J3089 Other allergic rhinitis: Secondary | ICD-10-CM | POA: Diagnosis not present

## 2017-10-13 DIAGNOSIS — J3081 Allergic rhinitis due to animal (cat) (dog) hair and dander: Secondary | ICD-10-CM | POA: Diagnosis not present

## 2017-10-18 DIAGNOSIS — J301 Allergic rhinitis due to pollen: Secondary | ICD-10-CM | POA: Diagnosis not present

## 2017-10-18 DIAGNOSIS — J3081 Allergic rhinitis due to animal (cat) (dog) hair and dander: Secondary | ICD-10-CM | POA: Diagnosis not present

## 2017-10-18 DIAGNOSIS — J3089 Other allergic rhinitis: Secondary | ICD-10-CM | POA: Diagnosis not present

## 2017-10-20 DIAGNOSIS — J3081 Allergic rhinitis due to animal (cat) (dog) hair and dander: Secondary | ICD-10-CM | POA: Diagnosis not present

## 2017-10-20 DIAGNOSIS — J3089 Other allergic rhinitis: Secondary | ICD-10-CM | POA: Diagnosis not present

## 2017-10-20 DIAGNOSIS — J301 Allergic rhinitis due to pollen: Secondary | ICD-10-CM | POA: Diagnosis not present

## 2017-10-24 DIAGNOSIS — J3089 Other allergic rhinitis: Secondary | ICD-10-CM | POA: Diagnosis not present

## 2017-10-24 DIAGNOSIS — J3081 Allergic rhinitis due to animal (cat) (dog) hair and dander: Secondary | ICD-10-CM | POA: Diagnosis not present

## 2017-10-24 DIAGNOSIS — J301 Allergic rhinitis due to pollen: Secondary | ICD-10-CM | POA: Diagnosis not present

## 2017-10-27 DIAGNOSIS — J3081 Allergic rhinitis due to animal (cat) (dog) hair and dander: Secondary | ICD-10-CM | POA: Diagnosis not present

## 2017-10-27 DIAGNOSIS — J301 Allergic rhinitis due to pollen: Secondary | ICD-10-CM | POA: Diagnosis not present

## 2017-10-27 DIAGNOSIS — J3089 Other allergic rhinitis: Secondary | ICD-10-CM | POA: Diagnosis not present

## 2017-11-01 DIAGNOSIS — J301 Allergic rhinitis due to pollen: Secondary | ICD-10-CM | POA: Diagnosis not present

## 2017-11-01 DIAGNOSIS — J3089 Other allergic rhinitis: Secondary | ICD-10-CM | POA: Diagnosis not present

## 2017-11-01 DIAGNOSIS — J3081 Allergic rhinitis due to animal (cat) (dog) hair and dander: Secondary | ICD-10-CM | POA: Diagnosis not present

## 2017-11-03 DIAGNOSIS — J3081 Allergic rhinitis due to animal (cat) (dog) hair and dander: Secondary | ICD-10-CM | POA: Diagnosis not present

## 2017-11-03 DIAGNOSIS — J301 Allergic rhinitis due to pollen: Secondary | ICD-10-CM | POA: Diagnosis not present

## 2017-11-03 DIAGNOSIS — J3089 Other allergic rhinitis: Secondary | ICD-10-CM | POA: Diagnosis not present

## 2017-11-07 DIAGNOSIS — J301 Allergic rhinitis due to pollen: Secondary | ICD-10-CM | POA: Diagnosis not present

## 2017-11-07 DIAGNOSIS — J3089 Other allergic rhinitis: Secondary | ICD-10-CM | POA: Diagnosis not present

## 2017-11-07 DIAGNOSIS — J3081 Allergic rhinitis due to animal (cat) (dog) hair and dander: Secondary | ICD-10-CM | POA: Diagnosis not present

## 2017-11-10 DIAGNOSIS — J3089 Other allergic rhinitis: Secondary | ICD-10-CM | POA: Diagnosis not present

## 2017-11-10 DIAGNOSIS — J3081 Allergic rhinitis due to animal (cat) (dog) hair and dander: Secondary | ICD-10-CM | POA: Diagnosis not present

## 2017-11-10 DIAGNOSIS — J301 Allergic rhinitis due to pollen: Secondary | ICD-10-CM | POA: Diagnosis not present

## 2017-11-16 DIAGNOSIS — J301 Allergic rhinitis due to pollen: Secondary | ICD-10-CM | POA: Diagnosis not present

## 2017-11-16 DIAGNOSIS — J3089 Other allergic rhinitis: Secondary | ICD-10-CM | POA: Diagnosis not present

## 2017-11-16 DIAGNOSIS — J3081 Allergic rhinitis due to animal (cat) (dog) hair and dander: Secondary | ICD-10-CM | POA: Diagnosis not present

## 2017-11-29 DIAGNOSIS — J3081 Allergic rhinitis due to animal (cat) (dog) hair and dander: Secondary | ICD-10-CM | POA: Diagnosis not present

## 2017-11-29 DIAGNOSIS — J3089 Other allergic rhinitis: Secondary | ICD-10-CM | POA: Diagnosis not present

## 2017-11-29 DIAGNOSIS — J301 Allergic rhinitis due to pollen: Secondary | ICD-10-CM | POA: Diagnosis not present

## 2017-12-01 DIAGNOSIS — J3089 Other allergic rhinitis: Secondary | ICD-10-CM | POA: Diagnosis not present

## 2017-12-01 DIAGNOSIS — J3081 Allergic rhinitis due to animal (cat) (dog) hair and dander: Secondary | ICD-10-CM | POA: Diagnosis not present

## 2017-12-01 DIAGNOSIS — J301 Allergic rhinitis due to pollen: Secondary | ICD-10-CM | POA: Diagnosis not present

## 2017-12-07 DIAGNOSIS — J3081 Allergic rhinitis due to animal (cat) (dog) hair and dander: Secondary | ICD-10-CM | POA: Diagnosis not present

## 2017-12-07 DIAGNOSIS — J3089 Other allergic rhinitis: Secondary | ICD-10-CM | POA: Diagnosis not present

## 2017-12-07 DIAGNOSIS — J301 Allergic rhinitis due to pollen: Secondary | ICD-10-CM | POA: Diagnosis not present

## 2017-12-15 DIAGNOSIS — J3081 Allergic rhinitis due to animal (cat) (dog) hair and dander: Secondary | ICD-10-CM | POA: Diagnosis not present

## 2017-12-15 DIAGNOSIS — J3089 Other allergic rhinitis: Secondary | ICD-10-CM | POA: Diagnosis not present

## 2017-12-15 DIAGNOSIS — J301 Allergic rhinitis due to pollen: Secondary | ICD-10-CM | POA: Diagnosis not present

## 2017-12-20 DIAGNOSIS — J3089 Other allergic rhinitis: Secondary | ICD-10-CM | POA: Diagnosis not present

## 2017-12-20 DIAGNOSIS — J3081 Allergic rhinitis due to animal (cat) (dog) hair and dander: Secondary | ICD-10-CM | POA: Diagnosis not present

## 2017-12-20 DIAGNOSIS — J301 Allergic rhinitis due to pollen: Secondary | ICD-10-CM | POA: Diagnosis not present

## 2017-12-27 DIAGNOSIS — J3089 Other allergic rhinitis: Secondary | ICD-10-CM | POA: Diagnosis not present

## 2017-12-27 DIAGNOSIS — J301 Allergic rhinitis due to pollen: Secondary | ICD-10-CM | POA: Diagnosis not present

## 2017-12-27 DIAGNOSIS — J3081 Allergic rhinitis due to animal (cat) (dog) hair and dander: Secondary | ICD-10-CM | POA: Diagnosis not present

## 2018-01-12 DIAGNOSIS — J3089 Other allergic rhinitis: Secondary | ICD-10-CM | POA: Diagnosis not present

## 2018-01-12 DIAGNOSIS — J3081 Allergic rhinitis due to animal (cat) (dog) hair and dander: Secondary | ICD-10-CM | POA: Diagnosis not present

## 2018-01-12 DIAGNOSIS — J301 Allergic rhinitis due to pollen: Secondary | ICD-10-CM | POA: Diagnosis not present

## 2018-01-17 DIAGNOSIS — J3081 Allergic rhinitis due to animal (cat) (dog) hair and dander: Secondary | ICD-10-CM | POA: Diagnosis not present

## 2018-01-17 DIAGNOSIS — J3089 Other allergic rhinitis: Secondary | ICD-10-CM | POA: Diagnosis not present

## 2018-01-17 DIAGNOSIS — J301 Allergic rhinitis due to pollen: Secondary | ICD-10-CM | POA: Diagnosis not present

## 2018-01-24 DIAGNOSIS — J3089 Other allergic rhinitis: Secondary | ICD-10-CM | POA: Diagnosis not present

## 2018-01-24 DIAGNOSIS — J3081 Allergic rhinitis due to animal (cat) (dog) hair and dander: Secondary | ICD-10-CM | POA: Diagnosis not present

## 2018-01-24 DIAGNOSIS — J301 Allergic rhinitis due to pollen: Secondary | ICD-10-CM | POA: Diagnosis not present

## 2018-01-30 DIAGNOSIS — J301 Allergic rhinitis due to pollen: Secondary | ICD-10-CM | POA: Diagnosis not present

## 2018-01-30 DIAGNOSIS — T781XXA Other adverse food reactions, not elsewhere classified, initial encounter: Secondary | ICD-10-CM | POA: Diagnosis not present

## 2018-01-30 DIAGNOSIS — J3089 Other allergic rhinitis: Secondary | ICD-10-CM | POA: Diagnosis not present

## 2018-01-30 DIAGNOSIS — J3081 Allergic rhinitis due to animal (cat) (dog) hair and dander: Secondary | ICD-10-CM | POA: Diagnosis not present

## 2018-01-30 DIAGNOSIS — R05 Cough: Secondary | ICD-10-CM | POA: Diagnosis not present

## 2018-02-03 DIAGNOSIS — J3081 Allergic rhinitis due to animal (cat) (dog) hair and dander: Secondary | ICD-10-CM | POA: Diagnosis not present

## 2018-02-03 DIAGNOSIS — J301 Allergic rhinitis due to pollen: Secondary | ICD-10-CM | POA: Diagnosis not present

## 2018-02-03 DIAGNOSIS — J3089 Other allergic rhinitis: Secondary | ICD-10-CM | POA: Diagnosis not present

## 2018-02-14 DIAGNOSIS — J301 Allergic rhinitis due to pollen: Secondary | ICD-10-CM | POA: Diagnosis not present

## 2018-02-14 DIAGNOSIS — J3089 Other allergic rhinitis: Secondary | ICD-10-CM | POA: Diagnosis not present

## 2018-02-14 DIAGNOSIS — J3081 Allergic rhinitis due to animal (cat) (dog) hair and dander: Secondary | ICD-10-CM | POA: Diagnosis not present

## 2018-02-16 DIAGNOSIS — Z01419 Encounter for gynecological examination (general) (routine) without abnormal findings: Secondary | ICD-10-CM | POA: Diagnosis not present

## 2018-02-16 DIAGNOSIS — Z1231 Encounter for screening mammogram for malignant neoplasm of breast: Secondary | ICD-10-CM | POA: Diagnosis not present

## 2018-02-16 DIAGNOSIS — Z6834 Body mass index (BMI) 34.0-34.9, adult: Secondary | ICD-10-CM | POA: Diagnosis not present

## 2018-02-21 DIAGNOSIS — J3081 Allergic rhinitis due to animal (cat) (dog) hair and dander: Secondary | ICD-10-CM | POA: Diagnosis not present

## 2018-02-21 DIAGNOSIS — J3089 Other allergic rhinitis: Secondary | ICD-10-CM | POA: Diagnosis not present

## 2018-02-21 DIAGNOSIS — J301 Allergic rhinitis due to pollen: Secondary | ICD-10-CM | POA: Diagnosis not present

## 2018-03-03 DIAGNOSIS — Z713 Dietary counseling and surveillance: Secondary | ICD-10-CM | POA: Diagnosis not present

## 2018-03-09 DIAGNOSIS — E1165 Type 2 diabetes mellitus with hyperglycemia: Secondary | ICD-10-CM | POA: Diagnosis not present

## 2018-03-09 DIAGNOSIS — E559 Vitamin D deficiency, unspecified: Secondary | ICD-10-CM | POA: Diagnosis not present

## 2018-03-09 DIAGNOSIS — E7800 Pure hypercholesterolemia, unspecified: Secondary | ICD-10-CM | POA: Diagnosis not present

## 2018-03-16 DIAGNOSIS — I10 Essential (primary) hypertension: Secondary | ICD-10-CM | POA: Diagnosis not present

## 2018-03-16 DIAGNOSIS — E1165 Type 2 diabetes mellitus with hyperglycemia: Secondary | ICD-10-CM | POA: Diagnosis not present

## 2018-03-16 DIAGNOSIS — E559 Vitamin D deficiency, unspecified: Secondary | ICD-10-CM | POA: Diagnosis not present

## 2018-03-16 DIAGNOSIS — E7800 Pure hypercholesterolemia, unspecified: Secondary | ICD-10-CM | POA: Diagnosis not present

## 2018-03-28 DIAGNOSIS — J301 Allergic rhinitis due to pollen: Secondary | ICD-10-CM | POA: Diagnosis not present

## 2018-03-28 DIAGNOSIS — J3081 Allergic rhinitis due to animal (cat) (dog) hair and dander: Secondary | ICD-10-CM | POA: Diagnosis not present

## 2018-03-28 DIAGNOSIS — J3089 Other allergic rhinitis: Secondary | ICD-10-CM | POA: Diagnosis not present

## 2018-03-31 DIAGNOSIS — Z713 Dietary counseling and surveillance: Secondary | ICD-10-CM | POA: Diagnosis not present

## 2018-04-03 DIAGNOSIS — D2261 Melanocytic nevi of right upper limb, including shoulder: Secondary | ICD-10-CM | POA: Diagnosis not present

## 2018-04-03 DIAGNOSIS — L738 Other specified follicular disorders: Secondary | ICD-10-CM | POA: Diagnosis not present

## 2018-04-03 DIAGNOSIS — D2262 Melanocytic nevi of left upper limb, including shoulder: Secondary | ICD-10-CM | POA: Diagnosis not present

## 2018-04-03 DIAGNOSIS — D2361 Other benign neoplasm of skin of right upper limb, including shoulder: Secondary | ICD-10-CM | POA: Diagnosis not present

## 2018-04-04 DIAGNOSIS — J3089 Other allergic rhinitis: Secondary | ICD-10-CM | POA: Diagnosis not present

## 2018-04-04 DIAGNOSIS — J301 Allergic rhinitis due to pollen: Secondary | ICD-10-CM | POA: Diagnosis not present

## 2018-04-04 DIAGNOSIS — J3081 Allergic rhinitis due to animal (cat) (dog) hair and dander: Secondary | ICD-10-CM | POA: Diagnosis not present

## 2018-04-06 DIAGNOSIS — J3081 Allergic rhinitis due to animal (cat) (dog) hair and dander: Secondary | ICD-10-CM | POA: Diagnosis not present

## 2018-04-06 DIAGNOSIS — J301 Allergic rhinitis due to pollen: Secondary | ICD-10-CM | POA: Diagnosis not present

## 2018-04-07 DIAGNOSIS — E6609 Other obesity due to excess calories: Secondary | ICD-10-CM | POA: Diagnosis not present

## 2018-04-07 DIAGNOSIS — I10 Essential (primary) hypertension: Secondary | ICD-10-CM | POA: Diagnosis not present

## 2018-04-11 DIAGNOSIS — J3089 Other allergic rhinitis: Secondary | ICD-10-CM | POA: Diagnosis not present

## 2018-04-11 DIAGNOSIS — J301 Allergic rhinitis due to pollen: Secondary | ICD-10-CM | POA: Diagnosis not present

## 2018-04-11 DIAGNOSIS — J3081 Allergic rhinitis due to animal (cat) (dog) hair and dander: Secondary | ICD-10-CM | POA: Diagnosis not present

## 2018-04-18 DIAGNOSIS — J301 Allergic rhinitis due to pollen: Secondary | ICD-10-CM | POA: Diagnosis not present

## 2018-04-18 DIAGNOSIS — J3089 Other allergic rhinitis: Secondary | ICD-10-CM | POA: Diagnosis not present

## 2018-04-18 DIAGNOSIS — J3081 Allergic rhinitis due to animal (cat) (dog) hair and dander: Secondary | ICD-10-CM | POA: Diagnosis not present

## 2018-04-25 DIAGNOSIS — J3081 Allergic rhinitis due to animal (cat) (dog) hair and dander: Secondary | ICD-10-CM | POA: Diagnosis not present

## 2018-04-25 DIAGNOSIS — J301 Allergic rhinitis due to pollen: Secondary | ICD-10-CM | POA: Diagnosis not present

## 2018-04-25 DIAGNOSIS — J3089 Other allergic rhinitis: Secondary | ICD-10-CM | POA: Diagnosis not present

## 2018-05-02 DIAGNOSIS — J3081 Allergic rhinitis due to animal (cat) (dog) hair and dander: Secondary | ICD-10-CM | POA: Diagnosis not present

## 2018-05-02 DIAGNOSIS — J3089 Other allergic rhinitis: Secondary | ICD-10-CM | POA: Diagnosis not present

## 2018-05-02 DIAGNOSIS — J301 Allergic rhinitis due to pollen: Secondary | ICD-10-CM | POA: Diagnosis not present

## 2018-05-08 DIAGNOSIS — J3081 Allergic rhinitis due to animal (cat) (dog) hair and dander: Secondary | ICD-10-CM | POA: Diagnosis not present

## 2018-05-08 DIAGNOSIS — J301 Allergic rhinitis due to pollen: Secondary | ICD-10-CM | POA: Diagnosis not present

## 2018-05-08 DIAGNOSIS — J3089 Other allergic rhinitis: Secondary | ICD-10-CM | POA: Diagnosis not present

## 2018-05-11 DIAGNOSIS — J301 Allergic rhinitis due to pollen: Secondary | ICD-10-CM | POA: Diagnosis not present

## 2018-05-11 DIAGNOSIS — J3089 Other allergic rhinitis: Secondary | ICD-10-CM | POA: Diagnosis not present

## 2018-05-11 DIAGNOSIS — J3081 Allergic rhinitis due to animal (cat) (dog) hair and dander: Secondary | ICD-10-CM | POA: Diagnosis not present

## 2018-05-16 DIAGNOSIS — J301 Allergic rhinitis due to pollen: Secondary | ICD-10-CM | POA: Diagnosis not present

## 2018-05-16 DIAGNOSIS — J3089 Other allergic rhinitis: Secondary | ICD-10-CM | POA: Diagnosis not present

## 2018-05-16 DIAGNOSIS — J3081 Allergic rhinitis due to animal (cat) (dog) hair and dander: Secondary | ICD-10-CM | POA: Diagnosis not present

## 2018-05-19 DIAGNOSIS — J019 Acute sinusitis, unspecified: Secondary | ICD-10-CM | POA: Diagnosis not present

## 2018-05-19 DIAGNOSIS — R05 Cough: Secondary | ICD-10-CM | POA: Diagnosis not present

## 2018-05-25 ENCOUNTER — Telehealth: Payer: Self-pay | Admitting: Gastroenterology

## 2018-05-25 DIAGNOSIS — J3081 Allergic rhinitis due to animal (cat) (dog) hair and dander: Secondary | ICD-10-CM | POA: Diagnosis not present

## 2018-05-25 DIAGNOSIS — J3089 Other allergic rhinitis: Secondary | ICD-10-CM | POA: Diagnosis not present

## 2018-05-25 DIAGNOSIS — J301 Allergic rhinitis due to pollen: Secondary | ICD-10-CM | POA: Diagnosis not present

## 2018-05-25 NOTE — Telephone Encounter (Addendum)
Patient is wanting to come back to our office states that she originally wanted to see Dr Ardis Hughs after Dr Deatra Ina left but there were no appts available. Pt is due for recall colon in feb and wants to become Dr Ardis Hughs pt. Records from Dr Michail Sermon received and placed on Dr Ardis Hughs desk for review

## 2018-05-26 ENCOUNTER — Telehealth: Payer: Self-pay | Admitting: Gastroenterology

## 2018-05-26 NOTE — Telephone Encounter (Signed)
The patient has been notified of this information and all questions answered.   Recall in Epic.

## 2018-05-26 NOTE — Telephone Encounter (Signed)
I reviewed a packet of information.  She was referred by her primary care physician I believe to consider repeat colonoscopy.  She has had several different GI providers.  She underwent a colonoscopy with Dr. Erskine Emery in 2012.  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  She then had a colonoscopy February 2015 with Dr. Deatra Ina.  He documented removal of an 8 mm polyp that on pathology was an adenomatous polyp.  She needs recall colonoscopy February 2020.

## 2018-06-06 DIAGNOSIS — H5210 Myopia, unspecified eye: Secondary | ICD-10-CM | POA: Diagnosis not present

## 2018-06-06 DIAGNOSIS — H5213 Myopia, bilateral: Secondary | ICD-10-CM | POA: Diagnosis not present

## 2018-06-08 DIAGNOSIS — J3089 Other allergic rhinitis: Secondary | ICD-10-CM | POA: Diagnosis not present

## 2018-06-08 DIAGNOSIS — J3081 Allergic rhinitis due to animal (cat) (dog) hair and dander: Secondary | ICD-10-CM | POA: Diagnosis not present

## 2018-06-08 DIAGNOSIS — J301 Allergic rhinitis due to pollen: Secondary | ICD-10-CM | POA: Diagnosis not present

## 2018-06-09 ENCOUNTER — Encounter: Payer: Self-pay | Admitting: Gastroenterology

## 2018-06-09 NOTE — Telephone Encounter (Signed)
Called pt to try to schedule Recall Colonoscopy due. No answer so I left a voicemail to call back. I have also mailed her a letter.

## 2018-06-14 DIAGNOSIS — J3081 Allergic rhinitis due to animal (cat) (dog) hair and dander: Secondary | ICD-10-CM | POA: Diagnosis not present

## 2018-06-14 DIAGNOSIS — J3089 Other allergic rhinitis: Secondary | ICD-10-CM | POA: Diagnosis not present

## 2018-06-14 DIAGNOSIS — J301 Allergic rhinitis due to pollen: Secondary | ICD-10-CM | POA: Diagnosis not present

## 2018-06-28 DIAGNOSIS — J3081 Allergic rhinitis due to animal (cat) (dog) hair and dander: Secondary | ICD-10-CM | POA: Diagnosis not present

## 2018-06-28 DIAGNOSIS — J3089 Other allergic rhinitis: Secondary | ICD-10-CM | POA: Diagnosis not present

## 2018-06-28 DIAGNOSIS — J301 Allergic rhinitis due to pollen: Secondary | ICD-10-CM | POA: Diagnosis not present

## 2018-06-30 DIAGNOSIS — Z713 Dietary counseling and surveillance: Secondary | ICD-10-CM | POA: Diagnosis not present

## 2018-08-23 DIAGNOSIS — J301 Allergic rhinitis due to pollen: Secondary | ICD-10-CM | POA: Diagnosis not present

## 2018-08-23 DIAGNOSIS — J3081 Allergic rhinitis due to animal (cat) (dog) hair and dander: Secondary | ICD-10-CM | POA: Diagnosis not present

## 2018-08-24 DIAGNOSIS — Z713 Dietary counseling and surveillance: Secondary | ICD-10-CM | POA: Diagnosis not present

## 2018-08-24 DIAGNOSIS — J3089 Other allergic rhinitis: Secondary | ICD-10-CM | POA: Diagnosis not present

## 2018-08-30 DIAGNOSIS — J3089 Other allergic rhinitis: Secondary | ICD-10-CM | POA: Diagnosis not present

## 2018-08-30 DIAGNOSIS — J301 Allergic rhinitis due to pollen: Secondary | ICD-10-CM | POA: Diagnosis not present

## 2018-08-30 DIAGNOSIS — J3081 Allergic rhinitis due to animal (cat) (dog) hair and dander: Secondary | ICD-10-CM | POA: Diagnosis not present

## 2018-09-08 DIAGNOSIS — J301 Allergic rhinitis due to pollen: Secondary | ICD-10-CM | POA: Diagnosis not present

## 2018-09-08 DIAGNOSIS — J3089 Other allergic rhinitis: Secondary | ICD-10-CM | POA: Diagnosis not present

## 2018-09-08 DIAGNOSIS — J3081 Allergic rhinitis due to animal (cat) (dog) hair and dander: Secondary | ICD-10-CM | POA: Diagnosis not present

## 2018-09-13 DIAGNOSIS — J3089 Other allergic rhinitis: Secondary | ICD-10-CM | POA: Diagnosis not present

## 2018-09-13 DIAGNOSIS — J301 Allergic rhinitis due to pollen: Secondary | ICD-10-CM | POA: Diagnosis not present

## 2018-09-13 DIAGNOSIS — J3081 Allergic rhinitis due to animal (cat) (dog) hair and dander: Secondary | ICD-10-CM | POA: Diagnosis not present

## 2018-09-19 DIAGNOSIS — J301 Allergic rhinitis due to pollen: Secondary | ICD-10-CM | POA: Diagnosis not present

## 2018-09-19 DIAGNOSIS — J3089 Other allergic rhinitis: Secondary | ICD-10-CM | POA: Diagnosis not present

## 2018-09-19 DIAGNOSIS — J3081 Allergic rhinitis due to animal (cat) (dog) hair and dander: Secondary | ICD-10-CM | POA: Diagnosis not present

## 2018-09-25 DIAGNOSIS — J3081 Allergic rhinitis due to animal (cat) (dog) hair and dander: Secondary | ICD-10-CM | POA: Diagnosis not present

## 2018-09-25 DIAGNOSIS — I10 Essential (primary) hypertension: Secondary | ICD-10-CM | POA: Diagnosis not present

## 2018-09-25 DIAGNOSIS — J301 Allergic rhinitis due to pollen: Secondary | ICD-10-CM | POA: Diagnosis not present

## 2018-09-25 DIAGNOSIS — E6609 Other obesity due to excess calories: Secondary | ICD-10-CM | POA: Diagnosis not present

## 2018-09-25 DIAGNOSIS — J3089 Other allergic rhinitis: Secondary | ICD-10-CM | POA: Diagnosis not present

## 2018-09-29 DIAGNOSIS — J3089 Other allergic rhinitis: Secondary | ICD-10-CM | POA: Diagnosis not present

## 2018-09-29 DIAGNOSIS — J301 Allergic rhinitis due to pollen: Secondary | ICD-10-CM | POA: Diagnosis not present

## 2018-09-29 DIAGNOSIS — J3081 Allergic rhinitis due to animal (cat) (dog) hair and dander: Secondary | ICD-10-CM | POA: Diagnosis not present

## 2018-10-02 DIAGNOSIS — J3081 Allergic rhinitis due to animal (cat) (dog) hair and dander: Secondary | ICD-10-CM | POA: Diagnosis not present

## 2018-10-02 DIAGNOSIS — J3089 Other allergic rhinitis: Secondary | ICD-10-CM | POA: Diagnosis not present

## 2018-10-02 DIAGNOSIS — J301 Allergic rhinitis due to pollen: Secondary | ICD-10-CM | POA: Diagnosis not present

## 2018-10-06 DIAGNOSIS — J3089 Other allergic rhinitis: Secondary | ICD-10-CM | POA: Diagnosis not present

## 2018-10-06 DIAGNOSIS — J3081 Allergic rhinitis due to animal (cat) (dog) hair and dander: Secondary | ICD-10-CM | POA: Diagnosis not present

## 2018-10-06 DIAGNOSIS — J301 Allergic rhinitis due to pollen: Secondary | ICD-10-CM | POA: Diagnosis not present

## 2018-10-10 DIAGNOSIS — J3089 Other allergic rhinitis: Secondary | ICD-10-CM | POA: Diagnosis not present

## 2018-10-10 DIAGNOSIS — J301 Allergic rhinitis due to pollen: Secondary | ICD-10-CM | POA: Diagnosis not present

## 2018-10-10 DIAGNOSIS — J3081 Allergic rhinitis due to animal (cat) (dog) hair and dander: Secondary | ICD-10-CM | POA: Diagnosis not present

## 2018-10-13 DIAGNOSIS — J3081 Allergic rhinitis due to animal (cat) (dog) hair and dander: Secondary | ICD-10-CM | POA: Diagnosis not present

## 2018-10-13 DIAGNOSIS — J301 Allergic rhinitis due to pollen: Secondary | ICD-10-CM | POA: Diagnosis not present

## 2018-10-13 DIAGNOSIS — J3089 Other allergic rhinitis: Secondary | ICD-10-CM | POA: Diagnosis not present

## 2018-10-16 DIAGNOSIS — Z86010 Personal history of colonic polyps: Secondary | ICD-10-CM | POA: Diagnosis not present

## 2018-10-16 DIAGNOSIS — R1031 Right lower quadrant pain: Secondary | ICD-10-CM | POA: Diagnosis not present

## 2018-10-20 DIAGNOSIS — J301 Allergic rhinitis due to pollen: Secondary | ICD-10-CM | POA: Diagnosis not present

## 2018-10-20 DIAGNOSIS — J3089 Other allergic rhinitis: Secondary | ICD-10-CM | POA: Diagnosis not present

## 2018-10-20 DIAGNOSIS — J3081 Allergic rhinitis due to animal (cat) (dog) hair and dander: Secondary | ICD-10-CM | POA: Diagnosis not present

## 2018-10-24 DIAGNOSIS — J3089 Other allergic rhinitis: Secondary | ICD-10-CM | POA: Diagnosis not present

## 2018-10-24 DIAGNOSIS — J3081 Allergic rhinitis due to animal (cat) (dog) hair and dander: Secondary | ICD-10-CM | POA: Diagnosis not present

## 2018-10-24 DIAGNOSIS — J301 Allergic rhinitis due to pollen: Secondary | ICD-10-CM | POA: Diagnosis not present

## 2018-11-02 DIAGNOSIS — Z713 Dietary counseling and surveillance: Secondary | ICD-10-CM | POA: Diagnosis not present

## 2018-11-08 DIAGNOSIS — J301 Allergic rhinitis due to pollen: Secondary | ICD-10-CM | POA: Diagnosis not present

## 2018-11-08 DIAGNOSIS — J3081 Allergic rhinitis due to animal (cat) (dog) hair and dander: Secondary | ICD-10-CM | POA: Diagnosis not present

## 2018-11-08 DIAGNOSIS — J3089 Other allergic rhinitis: Secondary | ICD-10-CM | POA: Diagnosis not present

## 2018-11-24 DIAGNOSIS — K648 Other hemorrhoids: Secondary | ICD-10-CM | POA: Diagnosis not present

## 2018-11-24 DIAGNOSIS — Z86010 Personal history of colonic polyps: Secondary | ICD-10-CM | POA: Diagnosis not present

## 2018-12-01 DIAGNOSIS — J3081 Allergic rhinitis due to animal (cat) (dog) hair and dander: Secondary | ICD-10-CM | POA: Diagnosis not present

## 2018-12-01 DIAGNOSIS — J3089 Other allergic rhinitis: Secondary | ICD-10-CM | POA: Diagnosis not present

## 2018-12-01 DIAGNOSIS — J301 Allergic rhinitis due to pollen: Secondary | ICD-10-CM | POA: Diagnosis not present

## 2018-12-07 DIAGNOSIS — J3081 Allergic rhinitis due to animal (cat) (dog) hair and dander: Secondary | ICD-10-CM | POA: Diagnosis not present

## 2018-12-07 DIAGNOSIS — J301 Allergic rhinitis due to pollen: Secondary | ICD-10-CM | POA: Diagnosis not present

## 2018-12-07 DIAGNOSIS — R05 Cough: Secondary | ICD-10-CM | POA: Diagnosis not present

## 2018-12-07 DIAGNOSIS — T781XXA Other adverse food reactions, not elsewhere classified, initial encounter: Secondary | ICD-10-CM | POA: Diagnosis not present

## 2018-12-07 DIAGNOSIS — J3089 Other allergic rhinitis: Secondary | ICD-10-CM | POA: Diagnosis not present

## 2018-12-19 DIAGNOSIS — J301 Allergic rhinitis due to pollen: Secondary | ICD-10-CM | POA: Diagnosis not present

## 2018-12-19 DIAGNOSIS — Z20828 Contact with and (suspected) exposure to other viral communicable diseases: Secondary | ICD-10-CM | POA: Diagnosis not present

## 2018-12-27 DIAGNOSIS — I10 Essential (primary) hypertension: Secondary | ICD-10-CM | POA: Diagnosis not present

## 2018-12-27 DIAGNOSIS — Z0000 Encounter for general adult medical examination without abnormal findings: Secondary | ICD-10-CM | POA: Diagnosis not present

## 2018-12-27 DIAGNOSIS — R739 Hyperglycemia, unspecified: Secondary | ICD-10-CM | POA: Diagnosis not present

## 2018-12-27 DIAGNOSIS — E559 Vitamin D deficiency, unspecified: Secondary | ICD-10-CM | POA: Diagnosis not present

## 2018-12-27 DIAGNOSIS — E785 Hyperlipidemia, unspecified: Secondary | ICD-10-CM | POA: Diagnosis not present

## 2018-12-29 DIAGNOSIS — Z713 Dietary counseling and surveillance: Secondary | ICD-10-CM | POA: Diagnosis not present

## 2019-01-05 DIAGNOSIS — R21 Rash and other nonspecific skin eruption: Secondary | ICD-10-CM | POA: Diagnosis not present

## 2019-01-05 DIAGNOSIS — R0789 Other chest pain: Secondary | ICD-10-CM | POA: Diagnosis not present

## 2019-01-05 DIAGNOSIS — J384 Edema of larynx: Secondary | ICD-10-CM | POA: Diagnosis not present

## 2019-12-19 ENCOUNTER — Ambulatory Visit: Payer: Medicaid Other | Attending: Internal Medicine

## 2019-12-19 DIAGNOSIS — Z20822 Contact with and (suspected) exposure to covid-19: Secondary | ICD-10-CM

## 2019-12-20 LAB — SARS-COV-2, NAA 2 DAY TAT

## 2019-12-20 LAB — NOVEL CORONAVIRUS, NAA: SARS-CoV-2, NAA: NOT DETECTED

## 2020-04-16 ENCOUNTER — Other Ambulatory Visit: Payer: Self-pay | Admitting: Allergy and Immunology

## 2020-04-16 ENCOUNTER — Ambulatory Visit
Admission: RE | Admit: 2020-04-16 | Discharge: 2020-04-16 | Disposition: A | Discharge status: Home / Self Care | Payer: 59 | Source: Ambulatory Visit | Attending: Allergy and Immunology | Admitting: Allergy and Immunology

## 2020-04-16 DIAGNOSIS — J209 Acute bronchitis, unspecified: Secondary | ICD-10-CM

## 2020-06-02 ENCOUNTER — Other Ambulatory Visit: Payer: 59

## 2020-06-02 DIAGNOSIS — Z20822 Contact with and (suspected) exposure to covid-19: Secondary | ICD-10-CM

## 2020-06-03 LAB — NOVEL CORONAVIRUS, NAA: SARS-CoV-2, NAA: NOT DETECTED

## 2020-06-03 LAB — SARS-COV-2, NAA 2 DAY TAT

## 2022-10-12 DIAGNOSIS — D2261 Melanocytic nevi of right upper limb, including shoulder: Secondary | ICD-10-CM | POA: Diagnosis not present

## 2022-10-12 DIAGNOSIS — D2262 Melanocytic nevi of left upper limb, including shoulder: Secondary | ICD-10-CM | POA: Diagnosis not present

## 2022-10-12 DIAGNOSIS — J3089 Other allergic rhinitis: Secondary | ICD-10-CM | POA: Diagnosis not present

## 2022-10-12 DIAGNOSIS — L821 Other seborrheic keratosis: Secondary | ICD-10-CM | POA: Diagnosis not present

## 2022-10-12 DIAGNOSIS — J301 Allergic rhinitis due to pollen: Secondary | ICD-10-CM | POA: Diagnosis not present

## 2022-10-12 DIAGNOSIS — D225 Melanocytic nevi of trunk: Secondary | ICD-10-CM | POA: Diagnosis not present

## 2022-10-12 DIAGNOSIS — J3081 Allergic rhinitis due to animal (cat) (dog) hair and dander: Secondary | ICD-10-CM | POA: Diagnosis not present

## 2022-11-02 DIAGNOSIS — J3081 Allergic rhinitis due to animal (cat) (dog) hair and dander: Secondary | ICD-10-CM | POA: Diagnosis not present

## 2022-11-02 DIAGNOSIS — J301 Allergic rhinitis due to pollen: Secondary | ICD-10-CM | POA: Diagnosis not present

## 2022-11-02 DIAGNOSIS — J3089 Other allergic rhinitis: Secondary | ICD-10-CM | POA: Diagnosis not present

## 2022-11-05 DIAGNOSIS — I10 Essential (primary) hypertension: Secondary | ICD-10-CM | POA: Diagnosis not present

## 2022-11-19 DIAGNOSIS — J3089 Other allergic rhinitis: Secondary | ICD-10-CM | POA: Diagnosis not present

## 2022-11-19 DIAGNOSIS — J301 Allergic rhinitis due to pollen: Secondary | ICD-10-CM | POA: Diagnosis not present

## 2022-11-19 DIAGNOSIS — J3081 Allergic rhinitis due to animal (cat) (dog) hair and dander: Secondary | ICD-10-CM | POA: Diagnosis not present

## 2022-12-03 DIAGNOSIS — J3089 Other allergic rhinitis: Secondary | ICD-10-CM | POA: Diagnosis not present

## 2022-12-03 DIAGNOSIS — J301 Allergic rhinitis due to pollen: Secondary | ICD-10-CM | POA: Diagnosis not present

## 2022-12-03 DIAGNOSIS — J3081 Allergic rhinitis due to animal (cat) (dog) hair and dander: Secondary | ICD-10-CM | POA: Diagnosis not present

## 2022-12-14 DIAGNOSIS — J3089 Other allergic rhinitis: Secondary | ICD-10-CM | POA: Diagnosis not present

## 2022-12-14 DIAGNOSIS — J301 Allergic rhinitis due to pollen: Secondary | ICD-10-CM | POA: Diagnosis not present

## 2022-12-14 DIAGNOSIS — J3081 Allergic rhinitis due to animal (cat) (dog) hair and dander: Secondary | ICD-10-CM | POA: Diagnosis not present

## 2022-12-22 DIAGNOSIS — J3081 Allergic rhinitis due to animal (cat) (dog) hair and dander: Secondary | ICD-10-CM | POA: Diagnosis not present

## 2022-12-22 DIAGNOSIS — J301 Allergic rhinitis due to pollen: Secondary | ICD-10-CM | POA: Diagnosis not present

## 2022-12-22 DIAGNOSIS — J3089 Other allergic rhinitis: Secondary | ICD-10-CM | POA: Diagnosis not present

## 2022-12-23 DIAGNOSIS — J4530 Mild persistent asthma, uncomplicated: Secondary | ICD-10-CM | POA: Diagnosis not present

## 2022-12-23 DIAGNOSIS — J309 Allergic rhinitis, unspecified: Secondary | ICD-10-CM | POA: Diagnosis not present
